# Patient Record
Sex: Female | Born: 1985 | Race: Black or African American | Hispanic: No | Marital: Single | Smoking: Never smoker
Health system: Southern US, Community
[De-identification: ages and names within clinical notes are randomized; demographics above are authoritative.]

## PROBLEM LIST (undated history)

## (undated) DIAGNOSIS — K8020 Calculus of gallbladder without cholecystitis without obstruction: Secondary | ICD-10-CM

## (undated) DIAGNOSIS — F419 Anxiety disorder, unspecified: Secondary | ICD-10-CM

---
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
## 2010-06-24 ENCOUNTER — Emergency Department: Payer: Self-pay | Admitting: Emergency Medicine

## 2010-11-20 ENCOUNTER — Emergency Department: Payer: Self-pay | Admitting: *Deleted

## 2011-07-01 ENCOUNTER — Emergency Department: Payer: Self-pay | Admitting: Emergency Medicine

## 2011-10-19 ENCOUNTER — Emergency Department: Payer: Self-pay | Admitting: Emergency Medicine

## 2011-10-21 LAB — BETA STREP CULTURE(ARMC)

## 2012-03-07 ENCOUNTER — Emergency Department: Payer: Self-pay | Admitting: Emergency Medicine

## 2012-03-08 ENCOUNTER — Emergency Department: Payer: Self-pay | Admitting: Emergency Medicine

## 2012-11-25 ENCOUNTER — Emergency Department: Payer: Self-pay | Admitting: Emergency Medicine

## 2012-11-25 LAB — CBC
HCT: 39.4 % (ref 35.0–47.0)
MCHC: 33.8 g/dL (ref 32.0–36.0)
Platelet: 375 10*3/uL (ref 150–440)
RBC: 4.6 10*6/uL (ref 3.80–5.20)

## 2012-11-25 LAB — COMPREHENSIVE METABOLIC PANEL
Alkaline Phosphatase: 54 U/L (ref 50–136)
Anion Gap: 6 — ABNORMAL LOW (ref 7–16)
BUN: 10 mg/dL (ref 7–18)
Chloride: 107 mmol/L (ref 98–107)
Creatinine: 0.78 mg/dL (ref 0.60–1.30)
EGFR (African American): >60
EGFR (Non-African Amer.): >60
Osmolality: 280 (ref 275–301)
Potassium: 3.5 mmol/L (ref 3.5–5.1)
Total Protein: 8.1 g/dL (ref 6.4–8.2)

## 2013-02-06 ENCOUNTER — Emergency Department: Payer: Self-pay | Admitting: Emergency Medicine

## 2013-02-06 LAB — URINALYSIS, COMPLETE
Bilirubin,UR: NEGATIVE
Blood: NEGATIVE
Ketone: NEGATIVE
Nitrite: NEGATIVE
Ph: 5 (ref 4.5–8.0)
RBC,UR: 6 /HPF (ref 0–5)
Specific Gravity: 1.028 (ref 1.003–1.030)
Squamous Epithelial: 22
WBC UR: 22 /HPF (ref 0–5)

## 2013-02-06 LAB — BASIC METABOLIC PANEL
Anion Gap: 3 — ABNORMAL LOW (ref 7–16)
BUN: 6 mg/dL — ABNORMAL LOW (ref 7–18)
Calcium, Total: 9.6 mg/dL (ref 8.5–10.1)
Chloride: 105 mmol/L (ref 98–107)
Co2: 29 mmol/L (ref 21–32)
Creatinine: 0.75 mg/dL (ref 0.60–1.30)
EGFR (African American): >60
Potassium: 3.6 mmol/L (ref 3.5–5.1)

## 2013-02-06 LAB — CBC
HCT: 41.2 % (ref 35.0–47.0)
RBC: 4.89 10*6/uL (ref 3.80–5.20)
RDW: 13.3 % (ref 11.5–14.5)
WBC: 6.8 10*3/uL (ref 3.6–11.0)

## 2013-02-09 ENCOUNTER — Emergency Department: Payer: Self-pay | Admitting: Emergency Medicine

## 2013-02-09 LAB — COMPREHENSIVE METABOLIC PANEL
Albumin: 4.1 g/dL (ref 3.4–5.0)
Alkaline Phosphatase: 47 U/L
BUN: 9 mg/dL (ref 7–18)
Creatinine: 0.73 mg/dL (ref 0.60–1.30)
EGFR (Non-African Amer.): >60
Potassium: 4.2 mmol/L (ref 3.5–5.1)
SGOT(AST): 44 U/L — ABNORMAL HIGH (ref 15–37)
Sodium: 139 mmol/L (ref 136–145)
Total Protein: 8.6 g/dL — ABNORMAL HIGH (ref 6.4–8.2)

## 2013-02-09 LAB — CBC
HGB: 14 g/dL (ref 12.0–16.0)
MCV: 85 fL (ref 80–100)
Platelet: 353 10*3/uL (ref 150–440)
RDW: 12.8 % (ref 11.5–14.5)

## 2013-02-09 LAB — URINALYSIS, COMPLETE
Blood: NEGATIVE
Glucose,UR: NEGATIVE mg/dL (ref 0–75)
Ketone: NEGATIVE
Protein: NEGATIVE
Specific Gravity: 1.026 (ref 1.003–1.030)
Squamous Epithelial: 11
WBC UR: 9 /HPF (ref 0–5)

## 2014-03-14 ENCOUNTER — Emergency Department: Payer: Self-pay | Admitting: Student

## 2014-04-18 ENCOUNTER — Emergency Department: Payer: Self-pay | Admitting: Emergency Medicine

## 2014-05-20 ENCOUNTER — Emergency Department: Payer: Self-pay | Admitting: Emergency Medicine

## 2014-05-27 ENCOUNTER — Emergency Department: Admit: 2014-05-27 | Disposition: A | Discharge status: Home / Self Care | Payer: Self-pay | Admitting: Student

## 2014-05-27 LAB — CBC WITH DIFFERENTIAL/PLATELET
Basophil #: 0.1 10*3/uL (ref 0.0–0.1)
Basophil %: 0.9 %
EOS ABS: 0.2 10*3/uL (ref 0.0–0.7)
Eosinophil %: 2.7 %
HCT: 46 % (ref 35.0–47.0)
HGB: 14.7 g/dL (ref 12.0–16.0)
Lymphocyte #: 1.8 10*3/uL (ref 1.0–3.6)
Lymphocyte %: 20.7 %
MCH: 27.1 pg (ref 26.0–34.0)
MCHC: 32 g/dL (ref 32.0–36.0)
MCV: 85 fL (ref 80–100)
MONO ABS: 0.4 x10 3/mm (ref 0.2–0.9)
Monocyte %: 4.1 %
NEUTROS ABS: 6.2 10*3/uL (ref 1.4–6.5)
Neutrophil %: 71.6 %
PLATELETS: 367 10*3/uL (ref 150–440)
RBC: 5.43 10*6/uL — AB (ref 3.80–5.20)
RDW: 13.6 % (ref 11.5–14.5)
WBC: 8.7 10*3/uL (ref 3.6–11.0)

## 2014-05-27 LAB — COMPREHENSIVE METABOLIC PANEL
AST: 37 U/L
Albumin: 4.7 g/dL
Alkaline Phosphatase: 53 U/L
Anion Gap: 8 (ref 7–16)
BUN: 8 mg/dL
Bilirubin,Total: 0.4 mg/dL
CREATININE: 0.73 mg/dL
Calcium, Total: 9.8 mg/dL
Chloride: 104 mmol/L
Co2: 28 mmol/L
EGFR (Non-African Amer.): >60
Glucose: 102 mg/dL — ABNORMAL HIGH
POTASSIUM: 4.3 mmol/L
SGPT (ALT): 49 U/L
Sodium: 140 mmol/L
TOTAL PROTEIN: 8.5 g/dL — AB

## 2014-05-27 LAB — URINALYSIS, COMPLETE
Bilirubin,UR: NEGATIVE
Blood: NEGATIVE
Glucose,UR: NEGATIVE mg/dL (ref 0–75)
Ketone: NEGATIVE
LEUKOCYTE ESTERASE: NEGATIVE
NITRITE: NEGATIVE
PROTEIN: NEGATIVE
Ph: 7 (ref 4.5–8.0)
RBC,UR: <1 /HPF (ref 0–5)
Specific Gravity: 1.013 (ref 1.003–1.030)
Squamous Epithelial: 3
WBC UR: 2 /HPF (ref 0–5)

## 2014-05-27 LAB — LIPASE, BLOOD: Lipase: 57 U/L — ABNORMAL HIGH

## 2014-06-17 ENCOUNTER — Emergency Department
Admit: 2014-06-17 | Disposition: A | Discharge status: Home / Self Care | Payer: Self-pay | Admitting: Emergency Medicine

## 2014-06-17 LAB — COMPREHENSIVE METABOLIC PANEL WITH GFR
Albumin: 4.5 g/dL
Alkaline Phosphatase: 48 U/L
Anion Gap: 4 — ABNORMAL LOW
BUN: 6 mg/dL
Bilirubin,Total: 0.3 mg/dL
Calcium, Total: 9.4 mg/dL
Chloride: 105 mmol/L
Co2: 28 mmol/L
Creatinine: 0.67 mg/dL
EGFR (African American): >60
EGFR (Non-African Amer.): >60
Glucose: 103 mg/dL — ABNORMAL HIGH
Potassium: 4.2 mmol/L
SGOT(AST): 27 U/L
SGPT (ALT): 22 U/L
Sodium: 137 mmol/L
Total Protein: 8.2 g/dL — ABNORMAL HIGH

## 2014-06-17 LAB — LIPASE, BLOOD: LIPASE: 30 U/L

## 2014-06-17 LAB — URINALYSIS, COMPLETE
BILIRUBIN, UR: NEGATIVE
Blood: NEGATIVE
GLUCOSE, UR: NEGATIVE mg/dL (ref 0–75)
KETONE: NEGATIVE
Leukocyte Esterase: NEGATIVE
Nitrite: NEGATIVE
Ph: 5 (ref 4.5–8.0)
Protein: NEGATIVE
SPECIFIC GRAVITY: 1.021 (ref 1.003–1.030)

## 2014-06-17 LAB — CBC
HCT: 44 % (ref 35.0–47.0)
HGB: 14.5 g/dL (ref 12.0–16.0)
MCH: 27.6 pg (ref 26.0–34.0)
MCHC: 32.9 g/dL (ref 32.0–36.0)
MCV: 84 fL (ref 80–100)
Platelet: 377 10*3/uL (ref 150–440)
RBC: 5.24 10*6/uL — ABNORMAL HIGH (ref 3.80–5.20)
RDW: 13.2 % (ref 11.5–14.5)
WBC: 7.9 10*3/uL (ref 3.6–11.0)

## 2014-09-13 ENCOUNTER — Emergency Department: Payer: Self-pay

## 2014-09-13 ENCOUNTER — Encounter: Payer: Self-pay | Admitting: *Deleted

## 2014-09-13 ENCOUNTER — Emergency Department
Admission: EM | Admit: 2014-09-13 | Discharge: 2014-09-13 | Disposition: A | Discharge status: Home / Self Care | Payer: Self-pay | Attending: Emergency Medicine | Admitting: Emergency Medicine

## 2014-09-13 DIAGNOSIS — R109 Unspecified abdominal pain: Secondary | ICD-10-CM

## 2014-09-13 DIAGNOSIS — Z791 Long term (current) use of non-steroidal anti-inflammatories (NSAID): Secondary | ICD-10-CM | POA: Insufficient documentation

## 2014-09-13 DIAGNOSIS — Z3202 Encounter for pregnancy test, result negative: Secondary | ICD-10-CM | POA: Insufficient documentation

## 2014-09-13 DIAGNOSIS — R1011 Right upper quadrant pain: Secondary | ICD-10-CM | POA: Insufficient documentation

## 2014-09-13 DIAGNOSIS — Z79899 Other long term (current) drug therapy: Secondary | ICD-10-CM | POA: Insufficient documentation

## 2014-09-13 HISTORY — DX: Anxiety disorder, unspecified: F41.9

## 2014-09-13 LAB — URINALYSIS COMPLETE WITH MICROSCOPIC (ARMC ONLY)
BILIRUBIN URINE: NEGATIVE
Glucose, UA: NEGATIVE mg/dL
Hgb urine dipstick: NEGATIVE
Ketones, ur: NEGATIVE mg/dL
NITRITE: NEGATIVE
Protein, ur: 30 mg/dL — AB
SPECIFIC GRAVITY, URINE: 1.036 — AB (ref 1.005–1.030)
pH: 5 (ref 5.0–8.0)

## 2014-09-13 LAB — CBC
HEMATOCRIT: 49 % — AB (ref 35.0–47.0)
HEMOGLOBIN: 15.6 g/dL (ref 12.0–16.0)
MCH: 26.8 pg (ref 26.0–34.0)
MCHC: 31.9 g/dL — ABNORMAL LOW (ref 32.0–36.0)
MCV: 84.2 fL (ref 80.0–100.0)
PLATELETS: 416 10*3/uL (ref 150–440)
RBC: 5.82 MIL/uL — ABNORMAL HIGH (ref 3.80–5.20)
RDW: 13.6 % (ref 11.5–14.5)
WBC: 10.4 10*3/uL (ref 3.6–11.0)

## 2014-09-13 LAB — COMPREHENSIVE METABOLIC PANEL
ALK PHOS: 46 U/L (ref 38–126)
ALT: 21 U/L (ref 14–54)
AST: 23 U/L (ref 15–41)
Albumin: 4.9 g/dL (ref 3.5–5.0)
Anion gap: 9 (ref 5–15)
BUN: 7 mg/dL (ref 6–20)
CO2: 26 mmol/L (ref 22–32)
Calcium: 10 mg/dL (ref 8.9–10.3)
Chloride: 105 mmol/L (ref 101–111)
Creatinine, Ser: 0.85 mg/dL (ref 0.44–1.00)
GLUCOSE: 100 mg/dL — AB (ref 65–99)
Potassium: 4.1 mmol/L (ref 3.5–5.1)
SODIUM: 140 mmol/L (ref 135–145)
TOTAL PROTEIN: 9.3 g/dL — AB (ref 6.5–8.1)
Total Bilirubin: 0.3 mg/dL (ref 0.3–1.2)

## 2014-09-13 LAB — POCT PREGNANCY, URINE: PREG TEST UR: NEGATIVE

## 2014-09-13 LAB — LIPASE, BLOOD: Lipase: 42 U/L (ref 22–51)

## 2014-09-13 MED ORDER — ONDANSETRON 4 MG PO TBDP
4.0000 mg | ORAL_TABLET | Freq: Once | ORAL | Status: AC
  Administered 2014-09-13: 4 mg via ORAL

## 2014-09-13 MED ORDER — DICYCLOMINE HCL 10 MG PO CAPS
20.0000 mg | ORAL_CAPSULE | Freq: Once | ORAL | Status: AC
  Administered 2014-09-13: 20 mg via ORAL
  Filled 2014-09-13: qty 2

## 2014-09-13 MED ORDER — METOCLOPRAMIDE HCL 10 MG PO TABS: 10.0000 mg | ORAL_TABLET | Freq: Four times a day (QID) | ORAL | Status: DC | PRN

## 2014-09-13 MED ORDER — HYDROCODONE-ACETAMINOPHEN 5-300 MG PO TABS: 1.0000 | ORAL_TABLET | ORAL | Status: DC | PRN

## 2014-09-13 MED ORDER — ONDANSETRON 4 MG PO TBDP
ORAL_TABLET | ORAL | Status: AC
  Administered 2014-09-13: 4 mg via ORAL
  Filled 2014-09-13: qty 1

## 2014-09-13 NOTE — ED Notes (Signed)
ruq abd pain,, left leg pain

## 2014-09-13 NOTE — Discharge Instructions (Signed)

## 2014-09-13 NOTE — ED Notes (Signed)
Pt reports RUQ abdominal pain x 2-3 days. Pt reports pain as stabbing in nature. Pt denies N/V/D

## 2014-09-13 NOTE — ED Provider Notes (Signed)
[facility] Emergency Department Provider Note  ____________________________________________  Time seen: Approximately 4 PM  I have reviewed the triage vital signs and the nursing notes.   HISTORY  Chief Complaint Abdominal Pain    HPI Alison Mathews is a 29 y.o. female with a history of sciatica who presents today with right upper quadrant pain for the past 3 days. She denies any nausea and vomiting with this. Says the pain is been constant and is worse with movement. Denies any recent heavy lifting or bending. No pain on deep inspiration or chest pain. No shortness of breath. No vaginal bleeding or discharge. No pain with urination. Says a family history of kidney stones as well as gallbladder issues.Denies being on any hormone supplements of birth control.   Past Medical History  Diagnosis Date  . Anxiety     There are no active problems to display for this patient.   History reviewed. No pertinent past surgical history.  Current Outpatient Rx  Name  Route  Sig  Dispense  Refill  . clonazePAM (KLONOPIN) 0.5 MG tablet   Oral   Take 0.5 mg by mouth 2 (two) times daily as needed for anxiety.         . cyclobenzaprine (FLEXERIL) 5 MG tablet   Oral   Take 5 mg by mouth every 8 (eight) hours as needed for muscle spasms.         Marland Kitchen FLUoxetine (PROZAC) 20 MG capsule   Oral   Take 20 mg by mouth daily.         . meloxicam (MOBIC) 15 MG tablet   Oral   Take 15 mg by mouth daily.           Allergies Motrin  No family history on file.  Social History History  Substance Use Topics  . Smoking status: Never Smoker   . Smokeless tobacco: Not on file  . Alcohol Use: No    Review of Systems Constitutional: No fever/chills Eyes: No visual changes. ENT: No sore throat. Cardiovascular: Denies chest pain. Respiratory: Denies shortness of breath. Gastrointestinal: No nausea, no vomiting.  No diarrhea.  No constipation. Genitourinary:  Negative for dysuria. Musculoskeletal: Negative for back pain. Skin: Negative for rash. Neurological: Negative for headaches, focal weakness or numbness.  10-point ROS otherwise negative.  ____________________________________________   PHYSICAL EXAM:  VITAL SIGNS: ED Triage Vitals  Enc Vitals Group     BP 09/13/14 1454 144/115 mmHg     Pulse Rate 09/13/14 1454 109     Resp 09/13/14 1454 20     Temp 09/13/14 1454 98.4 F (36.9 C)     Temp Source 09/13/14 1454 Oral     SpO2 09/13/14 1454 100 %     Weight 09/13/14 1454 185 lb (83.915 kg)     Height 09/13/14 1454  (1.6 m)     Head Cir --      Peak Flow --      Pain Score 09/13/14 1455 7     Pain Loc --      Pain Edu? --      Excl. in GC? --     Constitutional: Alert and oriented. Well appearing and in no acute distress. Eyes: Conjunctivae are normal. PERRL. EOMI. Head: Atraumatic. Nose: No congestion/rhinnorhea. Mouth/Throat: Mucous membranes are moist.  Oropharynx non-erythematous. Neck: No stridor.   Cardiovascular: Normal rate, regular rhythm. Grossly normal heart sounds.  Good peripheral circulation. Respiratory: Normal respiratory effort.  No retractions. Lungs  CTAB. Gastrointestinal: Soft with right upper quadrant tenderness with a negative Murphy sign. No right lower quadrant tenderness. Negative McBurney's point tenderness.. No distention. No abdominal bruits. No CVA tenderness. Musculoskeletal: No lower extremity tenderness nor edema.  No joint effusions. Neurologic:  Normal speech and language. No gross focal neurologic deficits are appreciated. No gait instability. Skin:  Skin is warm, dry and intact. No rash noted. Psychiatric: Mood and affect are normal. Speech and behavior are normal.  ____________________________________________   LABS (all labs ordered are listed, but only abnormal results are displayed)  Labs Reviewed  COMPREHENSIVE METABOLIC PANEL - Abnormal; Notable for the following:     Glucose, Bld 100 (*)    Total Protein 9.3 (*)    All other components within normal limits  CBC - Abnormal; Notable for the following:    RBC 5.82 (*)    HCT 49.0 (*)    MCHC 31.9 (*)    All other components within normal limits  URINALYSIS COMPLETEWITH MICROSCOPIC ([facility] ONLY) - Abnormal; Notable for the following:    Color, Urine YELLOW (*)    APPearance HAZY (*)    Specific Gravity, Urine 1.036 (*)    Protein, ur 30 (*)    Leukocytes, UA 1+ (*)    Bacteria, UA RARE (*)    Squamous Epithelial / LPF 6-30 (*)    All other components within normal limits  LIPASE, BLOOD  POC URINE PREG, ED  POCT PREGNANCY, URINE   ____________________________________________  EKG   ____________________________________________  RADIOLOGY  No cholelithiasis or sonographic evidence of acute cholecystitis. Mild increased hepatic parenchymal echogenicity as can be seen with hepatic steatosis. ____________________________________________   PROCEDURES    ____________________________________________   INITIAL IMPRESSION / ASSESSMENT AND PLAN / ED COURSE  Pertinent labs & imaging results that were available during my care of the patient were reviewed by me and considered in my medical decision making (see chart for details).  ----------------------------------------- 7:18 PM on 09/13/2014 -----------------------------------------  Patient resting comfortably at this time. Had vomited once in the emergency department and given Zofran and now is able to tolerate by mouth fluids. Asked again and still without any pain with deep breaths. Denies any chest pain at this time. Heart rate now 85 without any fluid intervention. Says will be able to follow up with primary care doctor at [facility]. We'll discharge home with nausea medicine as well as pain medications. Discussed with patient that she may need a HIDA scan to further evaluate her gallbladder. The patient is also saying now that the pain is been  ongoing for 1 month. Less likely to be one month of pulmonary embolus. Feel more likely to be gallbladder or abdominal etiology. ____________________________________________   FINAL CLINICAL IMPRESSION(S) / ED DIAGNOSES  Final diagnoses:  Abdominal pain      Myrna Blazer, MD 09/13/14 1919

## 2014-09-15 LAB — URINE CULTURE

## 2014-11-10 ENCOUNTER — Encounter: Payer: Self-pay | Admitting: Emergency Medicine

## 2014-11-10 ENCOUNTER — Emergency Department
Admission: EM | Admit: 2014-11-10 | Discharge: 2014-11-10 | Disposition: A | Discharge status: Home / Self Care | Payer: Self-pay | Attending: Emergency Medicine | Admitting: Emergency Medicine

## 2014-11-10 DIAGNOSIS — R1011 Right upper quadrant pain: Secondary | ICD-10-CM | POA: Insufficient documentation

## 2014-11-10 DIAGNOSIS — R1010 Upper abdominal pain, unspecified: Secondary | ICD-10-CM

## 2014-11-10 DIAGNOSIS — R1012 Left upper quadrant pain: Secondary | ICD-10-CM | POA: Insufficient documentation

## 2014-11-10 DIAGNOSIS — Z79899 Other long term (current) drug therapy: Secondary | ICD-10-CM | POA: Insufficient documentation

## 2014-11-10 DIAGNOSIS — R1110 Vomiting, unspecified: Secondary | ICD-10-CM | POA: Insufficient documentation

## 2014-11-10 DIAGNOSIS — R197 Diarrhea, unspecified: Secondary | ICD-10-CM | POA: Insufficient documentation

## 2014-11-10 DIAGNOSIS — Z791 Long term (current) use of non-steroidal anti-inflammatories (NSAID): Secondary | ICD-10-CM | POA: Insufficient documentation

## 2014-11-10 HISTORY — DX: Calculus of gallbladder without cholecystitis without obstruction: K80.20

## 2014-11-10 LAB — CBC WITH DIFFERENTIAL/PLATELET
BASOS PCT: 1 %
Basophils Absolute: 0.1 10*3/uL (ref 0–0.1)
Eosinophils Absolute: 0.3 10*3/uL (ref 0–0.7)
Eosinophils Relative: 2 %
HEMATOCRIT: 40.3 % (ref 35.0–47.0)
Hemoglobin: 13.5 g/dL (ref 12.0–16.0)
Lymphocytes Relative: 34 %
Lymphs Abs: 3.8 10*3/uL — ABNORMAL HIGH (ref 1.0–3.6)
MCH: 28.6 pg (ref 26.0–34.0)
MCHC: 33.5 g/dL (ref 32.0–36.0)
MCV: 85.2 fL (ref 80.0–100.0)
MONO ABS: 0.7 10*3/uL (ref 0.2–0.9)
Monocytes Relative: 6 %
NEUTROS ABS: 6.4 10*3/uL (ref 1.4–6.5)
NEUTROS PCT: 57 %
Platelets: 348 10*3/uL (ref 150–440)
RBC: 4.73 MIL/uL (ref 3.80–5.20)
RDW: 13.6 % (ref 11.5–14.5)
WBC: 11.3 10*3/uL — ABNORMAL HIGH (ref 3.6–11.0)

## 2014-11-10 LAB — COMPREHENSIVE METABOLIC PANEL
ALT: 32 U/L (ref 14–54)
AST: 34 U/L (ref 15–41)
Albumin: 4.3 g/dL (ref 3.5–5.0)
Alkaline Phosphatase: 47 U/L (ref 38–126)
Anion gap: 4 — ABNORMAL LOW (ref 5–15)
BUN: 8 mg/dL (ref 6–20)
CO2: 29 mmol/L (ref 22–32)
Calcium: 9.4 mg/dL (ref 8.9–10.3)
Chloride: 107 mmol/L (ref 101–111)
Creatinine, Ser: 0.8 mg/dL (ref 0.44–1.00)
GFR calc Af Amer: >60 mL/min (ref >60)
GFR calc non Af Amer: >60 mL/min (ref >60)
Glucose, Bld: 100 mg/dL — ABNORMAL HIGH (ref 65–99)
Potassium: 3.8 mmol/L (ref 3.5–5.1)
Sodium: 140 mmol/L (ref 135–145)
Total Bilirubin: 0.5 mg/dL (ref 0.3–1.2)
Total Protein: 7.9 g/dL (ref 6.5–8.1)

## 2014-11-10 LAB — ETHANOL: Alcohol, Ethyl (B): <5 mg/dL (ref <5)

## 2014-11-10 LAB — LIPASE, BLOOD: Lipase: 30 U/L (ref 22–51)

## 2014-11-10 MED ORDER — RANITIDINE HCL 150 MG PO CAPS: 150.0000 mg | ORAL_CAPSULE | Freq: Two times a day (BID) | ORAL | Status: DC

## 2014-11-10 MED ORDER — FAMOTIDINE 20 MG PO TABS
ORAL_TABLET | ORAL | Status: AC
  Filled 2014-11-10: qty 1

## 2014-11-10 MED ORDER — KETOROLAC TROMETHAMINE 30 MG/ML IJ SOLN
INTRAMUSCULAR | Status: AC
  Filled 2014-11-10: qty 1

## 2014-11-10 MED ORDER — ONDANSETRON 8 MG PO TBDP: 8.0000 mg | ORAL_TABLET | Freq: Three times a day (TID) | ORAL | Status: DC | PRN

## 2014-11-10 MED ORDER — ONDANSETRON HCL 4 MG/2ML IJ SOLN
4.0000 mg | Freq: Once | INTRAMUSCULAR | Status: AC
  Administered 2014-11-10: 4 mg via INTRAVENOUS
  Filled 2014-11-10: qty 2

## 2014-11-10 MED ORDER — GI COCKTAIL ~~LOC~~
30.0000 mL | ORAL | Status: AC
  Administered 2014-11-10: 30 mL via ORAL
  Filled 2014-11-10: qty 30

## 2014-11-10 MED ORDER — SUCRALFATE 1 G PO TABS: 1.0000 g | ORAL_TABLET | Freq: Four times a day (QID) | ORAL | Status: DC

## 2014-11-10 MED ORDER — FAMOTIDINE 20 MG PO TABS
40.0000 mg | ORAL_TABLET | Freq: Once | ORAL | Status: AC
  Administered 2014-11-10: 40 mg via ORAL
  Filled 2014-11-10: qty 2

## 2014-11-10 MED ORDER — KETOROLAC TROMETHAMINE 30 MG/ML IJ SOLN
30.0000 mg | Freq: Once | INTRAMUSCULAR | Status: AC
  Administered 2014-11-10: 30 mg via INTRAVENOUS

## 2014-11-10 NOTE — ED Notes (Signed)
Pt to rm 9 via EMS.  EMS report pt hx gall bladder problems.  Pt had RUQ pain starting yesterday afternoon.  Pt reports vomit x2 and diarrhea x 2.  Pt reports continued nausea.  Pt NAD at this time.

## 2014-11-10 NOTE — Discharge Instructions (Signed)

## 2014-11-10 NOTE — ED Provider Notes (Signed)
[facility] Emergency Department Provider Note  ____________________________________________  Time seen: 4:05 AM on arrival by EMS  I have reviewed the triage vital signs and the nursing notes.   HISTORY  Chief Complaint Abdominal Pain    HPI Alison Mathews is a 29 y.o. female who reports right upper quadrant pain that started about 3 PM yesterday. She reports a history of gallbladder problems but has not followed up or had her gallbladder removed yet. She reports vomiting and having 2 episodes of diarrhea twice in the meantime. She last ate at 3 AM this morning one hour ago. No fevers or chills. No trauma no chest pain or shortness of breath. No dysuria frequency urgency.     Past Medical History  Diagnosis Date  . Anxiety   . Gall stones      There are no active problems to display for this patient.    History reviewed. No pertinent past surgical history.   Current Outpatient Rx  Name  Route  Sig  Dispense  Refill  . clonazePAM (KLONOPIN) 0.5 MG tablet   Oral   Take 0.5 mg by mouth 2 (two) times daily as needed for anxiety.         Marland Kitchen FLUoxetine (PROZAC) 20 MG capsule   Oral   Take 20 mg by mouth daily.         . cyclobenzaprine (FLEXERIL) 5 MG tablet   Oral   Take 5 mg by mouth every 8 (eight) hours as needed for muscle spasms.         . Hydrocodone-Acetaminophen (VICODIN) 5-300 MG TABS   Oral   Take 1 tablet by mouth every 4 (four) hours as needed (pain).   12 each   0   . meloxicam (MOBIC) 15 MG tablet   Oral   Take 15 mg by mouth daily.         . metoCLOPramide (REGLAN) 10 MG tablet   Oral   Take 1 tablet (10 mg total) by mouth every 6 (six) hours as needed for nausea or vomiting.   12 tablet   1   . ondansetron (ZOFRAN ODT) 8 MG disintegrating tablet   Oral   Take 1 tablet (8 mg total) by mouth every 8 (eight) hours as needed for nausea or vomiting.   20 tablet   0   . PARoxetine (PAXIL) 20 MG tablet   Oral    Take 20 mg by mouth daily.         . ranitidine (ZANTAC) 150 MG capsule   Oral   Take 1 capsule (150 mg total) by mouth 2 (two) times daily.   28 capsule   0   . sucralfate (CARAFATE) 1 G tablet   Oral   Take 1 tablet (1 g total) by mouth 4 (four) times daily.   120 tablet   1      Allergies Motrin   History reviewed. No pertinent family history.  Social History Social History  Substance Use Topics  . Smoking status: Never Smoker   . Smokeless tobacco: None  . Alcohol Use: No    Review of Systems  Constitutional:   No fever or chills. No weight changes Eyes:   No blurry vision or double vision.  ENT:   No sore throat. Cardiovascular:   No chest pain. Respiratory:   No dyspnea or cough. Gastrointestinal:   Abdominal pain with vomiting and diarrhea.  No BRBPR or melena. Genitourinary:   Negative for  dysuria, urinary retention, bloody urine, or difficulty urinating. Musculoskeletal:   Negative for back pain. No joint swelling or pain. Skin:   Negative for rash. Neurological:   Negative for headaches, focal weakness or numbness. Psychiatric:  No anxiety or depression.   Endocrine:  No hot/cold intolerance, changes in energy, or sleep difficulty.  10-point ROS otherwise negative.  ____________________________________________   PHYSICAL EXAM:  VITAL SIGNS: ED Triage Vitals  Enc Vitals Group     BP 11/10/14 0407 138/89 mmHg     Pulse Rate 11/10/14 0407 113     Resp 11/10/14 0407 18     Temp 11/10/14 0407 98.5 F (36.9 C)     Temp Source 11/10/14 0407 Oral     SpO2 11/10/14 0407 99 %     Weight 11/10/14 0407 180 lb (81.647 kg)     Height 11/10/14 0407  (1.6 m)     Head Cir --      Peak Flow --      Pain Score 11/10/14 0408 8     Pain Loc --      Pain Edu? --      Excl. in GC? --      Constitutional:   Alert and oriented. Well appearing and in no distress. Eyes:   No scleral icterus. No conjunctival pallor. PERRL. EOMI ENT   Head:    Normocephalic and atraumatic.   Nose:   No congestion/rhinnorhea. No septal hematoma   Mouth/Throat:   MMM, no pharyngeal erythema. No peritonsillar mass. No uvula shift.   Neck:   No stridor. No SubQ emphysema. No meningismus. Hematological/Lymphatic/Immunilogical:   No cervical lymphadenopathy. Cardiovascular:   RRR. Normal and symmetric distal pulses are present in all extremities. No murmurs, rubs, or gallops. Respiratory:   Normal respiratory effort without tachypnea nor retractions. Breath sounds are clear and equal bilaterally. No wheezes/rales/rhonchi. Gastrointestinal:   Soft with left upper quadrant and epigastric tenderness as well as mild right flank tenderness. No significant right upper quadrant tenderness no McBurney's point tenderness. No distention. There is no CVA tenderness.  No rebound, rigidity, or guarding. Genitourinary:   deferred Musculoskeletal:   Nontender with normal range of motion in all extremities. No joint effusions.  No lower extremity tenderness.  No edema. Neurologic:   Normal speech and language.  CN 2-10 normal. Motor grossly intact. No pronator drift.  Normal gait. No gross focal neurologic deficits are appreciated.  Skin:    Skin is warm, dry and intact. No rash noted.  No petechiae, purpura, or bullae. Psychiatric:   Mood and affect are normal. Speech and behavior are normal. Patient exhibits appropriate insight and judgment.  ____________________________________________    LABS (pertinent positives/negatives) (all labs ordered are listed, but only abnormal results are displayed) Labs Reviewed  COMPREHENSIVE METABOLIC PANEL - Abnormal; Notable for the following:    Glucose, Bld 100 (*)    Anion gap 4 (*)    All other components within normal limits  CBC WITH DIFFERENTIAL/PLATELET - Abnormal; Notable for the following:    WBC 11.3 (*)    Lymphs Abs 3.8 (*)    All other components within normal limits  LIPASE, BLOOD  ETHANOL    ____________________________________________   EKG  Interpreted by me Sinus tachycardia rate 110, normal axis intervals QRS and ST segments and T waves. There is an isolated T-wave inversion in lead 3 which is nonspecific.  ____________________________________________    RADIOLOGY  Recent ultrasound right upper quadrant in July of this year  unremarkable. No gallstones at that time.  ____________________________________________   PROCEDURES   ____________________________________________   INITIAL IMPRESSION / ASSESSMENT AND PLAN / ED COURSE  Pertinent labs & imaging results that were available during my care of the patient were reviewed by me and considered in my medical decision making (see chart for details).  Patient presents with upper abdominal pain after recent the eating and also the context of being asleep at night and lying flat, most consistent with gastritis or GERD. With the recent negative ultrasound I have a very low suspicion that this could be biliary in nature especially as the patient is afebrile. We'll check labs and if there are any significant abnormalities with an reassess and consider abdominal imaging. Zofran and Toradol for pain as well as GI cocktail and Pepcid.  ----------------------------------------- 5:41 AM on 11/10/2014 -----------------------------------------  Labs completely unremarkable. Patient is calm and comfortable although she is requesting opioids for pain and stating that she's had inadequate relief of pain with Toradol. Objectively this does not appear to be the case. Abdomen remains nontender in the right upper quadrant. Vital signs are completely normal after the initial arrival vital signs, with heart rate in the 80s. Low suspicion for pancreatitis AAA TAD PID and ectopic or pregnancy appendicitis perforation or obstruction. Low suspicion for any infectious origin. We'll give her Carafate and Zantac and have her follow up with  primary care.     ____________________________________________   FINAL CLINICAL IMPRESSION(S) / ED DIAGNOSES  Final diagnoses:  Upper abdominal pain      Sharman Cheek, MD 11/10/14 [id number]

## 2014-11-12 ENCOUNTER — Emergency Department: Payer: Self-pay

## 2014-11-12 ENCOUNTER — Emergency Department
Admission: EM | Admit: 2014-11-12 | Discharge: 2014-11-12 | Disposition: A | Discharge status: Home / Self Care | Payer: Self-pay | Attending: Student | Admitting: Student

## 2014-11-12 ENCOUNTER — Encounter: Payer: Self-pay | Admitting: Emergency Medicine

## 2014-11-12 DIAGNOSIS — R197 Diarrhea, unspecified: Secondary | ICD-10-CM | POA: Insufficient documentation

## 2014-11-12 DIAGNOSIS — Z79899 Other long term (current) drug therapy: Secondary | ICD-10-CM | POA: Insufficient documentation

## 2014-11-12 DIAGNOSIS — R1013 Epigastric pain: Secondary | ICD-10-CM | POA: Insufficient documentation

## 2014-11-12 DIAGNOSIS — R52 Pain, unspecified: Secondary | ICD-10-CM

## 2014-11-12 DIAGNOSIS — Z3202 Encounter for pregnancy test, result negative: Secondary | ICD-10-CM | POA: Insufficient documentation

## 2014-11-12 DIAGNOSIS — R110 Nausea: Secondary | ICD-10-CM | POA: Insufficient documentation

## 2014-11-12 DIAGNOSIS — R1011 Right upper quadrant pain: Secondary | ICD-10-CM | POA: Insufficient documentation

## 2014-11-12 LAB — URINALYSIS COMPLETE WITH MICROSCOPIC (ARMC ONLY)
Bilirubin Urine: NEGATIVE
GLUCOSE, UA: NEGATIVE mg/dL
KETONES UR: NEGATIVE mg/dL
Leukocytes, UA: NEGATIVE
Nitrite: NEGATIVE
PROTEIN: NEGATIVE mg/dL
RBC / HPF: NONE SEEN RBC/hpf (ref 0–5)
Specific Gravity, Urine: 1.012 (ref 1.005–1.030)
pH: 7 (ref 5.0–8.0)

## 2014-11-12 LAB — CBC
HCT: 42.6 % (ref 35.0–47.0)
Hemoglobin: 14.1 g/dL (ref 12.0–16.0)
MCH: 27.7 pg (ref 26.0–34.0)
MCHC: 33 g/dL (ref 32.0–36.0)
MCV: 83.9 fL (ref 80.0–100.0)
PLATELETS: 349 10*3/uL (ref 150–440)
RBC: 5.08 MIL/uL (ref 3.80–5.20)
RDW: 13.5 % (ref 11.5–14.5)
WBC: 6.9 10*3/uL (ref 3.6–11.0)

## 2014-11-12 LAB — COMPREHENSIVE METABOLIC PANEL
ALK PHOS: 48 U/L (ref 38–126)
ALT: 30 U/L (ref 14–54)
AST: 28 U/L (ref 15–41)
Albumin: 4.5 g/dL (ref 3.5–5.0)
Anion gap: 7 (ref 5–15)
BUN: 10 mg/dL (ref 6–20)
CALCIUM: 9.6 mg/dL (ref 8.9–10.3)
CHLORIDE: 105 mmol/L (ref 101–111)
CO2: 27 mmol/L (ref 22–32)
CREATININE: 0.87 mg/dL (ref 0.44–1.00)
GFR calc Af Amer: >60 mL/min (ref >60)
GFR calc non Af Amer: >60 mL/min (ref >60)
GLUCOSE: 95 mg/dL (ref 65–99)
Potassium: 4 mmol/L (ref 3.5–5.1)
SODIUM: 139 mmol/L (ref 135–145)
Total Bilirubin: 0.4 mg/dL (ref 0.3–1.2)
Total Protein: 8.2 g/dL — ABNORMAL HIGH (ref 6.5–8.1)

## 2014-11-12 LAB — LIPASE, BLOOD: LIPASE: 28 U/L (ref 22–51)

## 2014-11-12 MED ORDER — ONDANSETRON 4 MG PO TBDP
4.0000 mg | ORAL_TABLET | Freq: Once | ORAL | Status: AC
  Administered 2014-11-12: 4 mg via ORAL
  Filled 2014-11-12: qty 1

## 2014-11-12 MED ORDER — KETOROLAC TROMETHAMINE 60 MG/2ML IM SOLN
60.0000 mg | Freq: Once | INTRAMUSCULAR | Status: AC
  Administered 2014-11-12: 60 mg via INTRAMUSCULAR
  Filled 2014-11-12: qty 2

## 2014-11-12 MED ORDER — GI COCKTAIL ~~LOC~~
30.0000 mL | Freq: Once | ORAL | Status: AC
  Administered 2014-11-12: 30 mL via ORAL
  Filled 2014-11-12: qty 30

## 2014-11-12 NOTE — ED Provider Notes (Signed)
[facility] Emergency Department Provider Note  ____________________________________________  Time seen: Approximately 2:13 PM  I have reviewed the triage vital signs and the nursing notes.   HISTORY  Chief Complaint Abdominal Pain    HPI Alison Mathews is a 29 y.o. female history of anxiety, depression, gallstones presents for evaluation of several months of right upper quadrant pain and epigastric pain, recurrent. the patient reports her pain was due to "gallbladder issues" in the past. She has had pain on and off for the past 2-3 months. She reports that she began having a flare of her pain 2 days ago. She has had burning aching right upper quadrant and epigastric pain which has been constant since onset. No modifying factors. She has had nausea but no vomiting. She had some nonbloody diarrhea yesterday but none today. No chest pain or difficulty breathing. Currently her symptoms are mild to moderate.   Past Medical History  Diagnosis Date  . Anxiety   . Gall stones     There are no active problems to display for this patient.   History reviewed. No pertinent past surgical history.  Current Outpatient Rx  Name  Route  Sig  Dispense  Refill  . clonazePAM (KLONOPIN) 0.5 MG tablet   Oral   Take 0.5 mg by mouth 2 (two) times daily as needed for anxiety.         Marland Kitchen FLUoxetine (PROZAC) 20 MG capsule   Oral   Take 20 mg by mouth daily.         . meloxicam (MOBIC) 15 MG tablet   Oral   Take 15 mg by mouth at bedtime.          . ondansetron (ZOFRAN ODT) 8 MG disintegrating tablet   Oral   Take 1 tablet (8 mg total) by mouth every 8 (eight) hours as needed for nausea or vomiting.   20 tablet   0   . ranitidine (ZANTAC) 150 MG capsule   Oral   Take 1 capsule (150 mg total) by mouth 2 (two) times daily.   28 capsule   0   . sucralfate (CARAFATE) 1 G tablet   Oral   Take 1 tablet (1 g total) by mouth 4 (four) times daily.   120 tablet    1   . Hydrocodone-Acetaminophen (VICODIN) 5-300 MG TABS   Oral   Take 1 tablet by mouth every 4 (four) hours as needed (pain). Patient not taking: Reported on 11/12/2014   12 each   0   . metoCLOPramide (REGLAN) 10 MG tablet   Oral   Take 1 tablet (10 mg total) by mouth every 6 (six) hours as needed for nausea or vomiting. Patient not taking: Reported on 11/12/2014   12 tablet   1     Allergies Motrin  No family history on file.  Social History Social History  Substance Use Topics  . Smoking status: Never Smoker   . Smokeless tobacco: None  . Alcohol Use: No    Review of Systems Constitutional: No fever/chills Eyes: No visual changes. ENT: No sore throat. Cardiovascular: Denies chest pain. Respiratory: Denies shortness of breath. Gastrointestinal: + abdominal pain.  + nausea, no vomiting.  + diarrhea.  No constipation. Genitourinary: Negative for dysuria. Musculoskeletal: Negative for back pain. Skin: Negative for rash. Neurological: Negative for headaches, focal weakness or numbness.  10-point ROS otherwise negative.  ____________________________________________   PHYSICAL EXAM:  VITAL SIGNS: ED Triage Vitals  Enc Vitals Group  BP 11/12/14 1239 131/86 mmHg     Pulse Rate 11/12/14 1239 105     Resp 11/12/14 1239 20     Temp 11/12/14 1239 98.3 F (36.8 C)     Temp Source 11/12/14 1239 Oral     SpO2 11/12/14 1239 100 %     Weight 11/12/14 1239 180 lb (81.647 kg)     Height 11/12/14 1239  (1.6 m)     Head Cir --      Peak Flow --      Pain Score 11/12/14 1250 8     Pain Loc --      Pain Edu? --      Excl. in GC? --     Constitutional: Alert and oriented. Well appearing and in no acute distress. Eyes: Conjunctivae are normal. PERRL. EOMI. Head: Atraumatic. Nose: No congestion/rhinnorhea. Mouth/Throat: Mucous membranes are moist.  Oropharynx non-erythematous. Neck: No stridor.   Cardiovascular: Normal rate, regular rhythm. Grossly normal  heart sounds.  Good peripheral circulation. Respiratory: Normal respiratory effort.  No retractions. Lungs CTAB. Gastrointestinal: Soft with mild epigastric and right upper quadrant tenderness to palpation, no rebound or guarding.No CVA tenderness. Genitourinary: deferred Musculoskeletal: No lower extremity tenderness nor edema.  No joint effusions. Neurologic:  Normal speech and language. No gross focal neurologic deficits are appreciated. No gait instability. Skin:  Skin is warm, dry and intact. No rash noted. Psychiatric: Mood and affect are normal. Speech and behavior are normal.  ____________________________________________   LABS (all labs ordered are listed, but only abnormal results are displayed)  Labs Reviewed  COMPREHENSIVE METABOLIC PANEL - Abnormal; Notable for the following:    Total Protein 8.2 (*)    All other components within normal limits  URINALYSIS COMPLETEWITH MICROSCOPIC ([facility] ONLY) - Abnormal; Notable for the following:    Color, Urine YELLOW (*)    APPearance CLEAR (*)    Hgb urine dipstick 1+ (*)    Bacteria, UA RARE (*)    Squamous Epithelial / LPF 0-5 (*)    All other components within normal limits  LIPASE, BLOOD  CBC  POC URINE PREG, ED   ____________________________________________  EKG  ED ECG REPORT I, Gayla Doss, the attending physician, personally viewed and interpreted this ECG.   Date: 11/12/2014  EKG Time: 12:44  Rate: 101  Rhythm: sinus tachycardia  Axis: Normal axis  Intervals:none  ST&T Change: no acute ST elevation  ____________________________________________  RADIOLOGY  RUQ ultrasound FINDINGS: Gallbladder:  No gallstones or wall thickening visualized. No sonographic Murphy sign noted.  Common bile duct:  Diameter: Normal caliber, 3 mm.  Liver:  Increased echotexture throughout the liver suggesting fatty infiltration. No focal abnormality.  IMPRESSION: Fatty liver. Otherwise unremarkable right upper  quadrant ultrasound.   ____________________________________________   PROCEDURES  Procedure(s) performed: None  Critical Care performed: No  ____________________________________________   INITIAL IMPRESSION / ASSESSMENT AND PLAN / ED COURSE  Pertinent labs & imaging results that were available during my care of the patient were reviewed by me and considered in my medical decision making (see chart for details).  Alison Mathews is a 29 y.o. female history of anxiety, depression, gallstones presents for evaluation of several months of right upper quadrant pain and epigastric pain. On exam, she is very well-appearing and in no acute distress. She was initially mild tachycardic on arrival however this has resolved at the time of my examination. She has mild epigastric and right upper quadrant tenderness to palpation.CBC, CMP and lipase  are unremarkable. Urinalysis not consistent with infection and she denies any urinary complaints. RUQ Ultrasound shows fatty liver but no acute bladder pathology. Suspect her symptoms may be secondary to GERD or gastritis. We discussed that she needs to be compliant with her antacid medications and follow-up with her primary care doctor as soon as possible. I discussed with her that she may need a GI referral in the future if her symptoms are ongoing. EKG is reassuring, not consistent with atypical ACS. We discussed return precautions, need for PCP follow-up and she is comfortable with the discharge plan. ____________________________________________   FINAL CLINICAL IMPRESSION(S) / ED DIAGNOSES  Final diagnoses:  RUQ abdominal pain      Gayla Doss, MD 11/12/14 1540

## 2014-11-12 NOTE — ED Notes (Signed)
Pt reports RUQ pain, hx of gallbladder issues. Pt reports nausea, denies diarrhea.

## 2014-11-12 NOTE — ED Notes (Signed)
POCT preg neg.  

## 2014-11-12 NOTE — ED Notes (Signed)
Patient transported to Ultrasound 

## 2014-11-12 NOTE — ED Notes (Addendum)
Pt reports right upper quadrant pain for two days. Pt reports diarrhea yesterday but none today. Pt reports feeling nauseous but denies vomiting. Pt reports having no appetite and denies eating or drinking since yesterday.

## 2015-03-30 ENCOUNTER — Emergency Department
Admission: EM | Admit: 2015-03-30 | Discharge: 2015-03-30 | Disposition: A | Discharge status: Home / Self Care | Payer: Self-pay | Attending: Emergency Medicine | Admitting: Emergency Medicine

## 2015-03-30 ENCOUNTER — Emergency Department: Payer: Self-pay

## 2015-03-30 ENCOUNTER — Encounter: Payer: Self-pay | Admitting: Emergency Medicine

## 2015-03-30 DIAGNOSIS — Z79899 Other long term (current) drug therapy: Secondary | ICD-10-CM | POA: Insufficient documentation

## 2015-03-30 DIAGNOSIS — Z791 Long term (current) use of non-steroidal anti-inflammatories (NSAID): Secondary | ICD-10-CM | POA: Insufficient documentation

## 2015-03-30 DIAGNOSIS — R1011 Right upper quadrant pain: Secondary | ICD-10-CM

## 2015-03-30 DIAGNOSIS — F419 Anxiety disorder, unspecified: Secondary | ICD-10-CM | POA: Insufficient documentation

## 2015-03-30 DIAGNOSIS — N390 Urinary tract infection, site not specified: Secondary | ICD-10-CM | POA: Insufficient documentation

## 2015-03-30 DIAGNOSIS — Z3202 Encounter for pregnancy test, result negative: Secondary | ICD-10-CM | POA: Insufficient documentation

## 2015-03-30 LAB — COMPREHENSIVE METABOLIC PANEL
ALBUMIN: 4.6 g/dL (ref 3.5–5.0)
ALK PHOS: 45 U/L (ref 38–126)
ALT: 21 U/L (ref 14–54)
ANION GAP: 6 (ref 5–15)
AST: 23 U/L (ref 15–41)
BUN: 10 mg/dL (ref 6–20)
CALCIUM: 9.9 mg/dL (ref 8.9–10.3)
CO2: 29 mmol/L (ref 22–32)
CREATININE: 0.82 mg/dL (ref 0.44–1.00)
Chloride: 107 mmol/L (ref 101–111)
GFR calc Af Amer: >60 mL/min (ref >60)
GFR calc non Af Amer: >60 mL/min (ref >60)
GLUCOSE: 95 mg/dL (ref 65–99)
Potassium: 4 mmol/L (ref 3.5–5.1)
Sodium: 142 mmol/L (ref 135–145)
Total Bilirubin: 0.3 mg/dL (ref 0.3–1.2)
Total Protein: 8.7 g/dL — ABNORMAL HIGH (ref 6.5–8.1)

## 2015-03-30 LAB — URINALYSIS COMPLETE WITH MICROSCOPIC (ARMC ONLY)
BILIRUBIN URINE: NEGATIVE
GLUCOSE, UA: NEGATIVE mg/dL
KETONES UR: NEGATIVE mg/dL
NITRITE: NEGATIVE
PROTEIN: NEGATIVE mg/dL
Specific Gravity, Urine: 1.028 (ref 1.005–1.030)
pH: 6 (ref 5.0–8.0)

## 2015-03-30 LAB — CBC
HCT: 46.4 % (ref 35.0–47.0)
Hemoglobin: 14.9 g/dL (ref 12.0–16.0)
MCH: 27.4 pg (ref 26.0–34.0)
MCHC: 32 g/dL (ref 32.0–36.0)
MCV: 85.5 fL (ref 80.0–100.0)
PLATELETS: 420 10*3/uL (ref 150–440)
RBC: 5.43 MIL/uL — ABNORMAL HIGH (ref 3.80–5.20)
RDW: 13.7 % (ref 11.5–14.5)
WBC: 10.8 10*3/uL (ref 3.6–11.0)

## 2015-03-30 LAB — POCT PREGNANCY, URINE: Preg Test, Ur: NEGATIVE

## 2015-03-30 LAB — LIPASE, BLOOD: Lipase: 39 U/L (ref 11–51)

## 2015-03-30 MED ORDER — MORPHINE SULFATE (PF) 4 MG/ML IV SOLN
INTRAVENOUS | Status: AC
  Administered 2015-03-30: 4 mg via INTRAVENOUS
  Filled 2015-03-30: qty 1

## 2015-03-30 MED ORDER — MORPHINE SULFATE (PF) 4 MG/ML IV SOLN
4.0000 mg | Freq: Once | INTRAVENOUS | Status: AC
  Administered 2015-03-30: 4 mg via INTRAVENOUS

## 2015-03-30 MED ORDER — SODIUM CHLORIDE 0.9 % IV BOLUS (SEPSIS)
1000.0000 mL | Freq: Once | INTRAVENOUS | Status: AC
  Administered 2015-03-30: 1000 mL via INTRAVENOUS

## 2015-03-30 MED ORDER — ONDANSETRON HCL 4 MG/2ML IJ SOLN
INTRAMUSCULAR | Status: AC
  Administered 2015-03-30: 4 mg via INTRAVENOUS
  Filled 2015-03-30: qty 2

## 2015-03-30 MED ORDER — CEPHALEXIN 500 MG PO CAPS: 500.0000 mg | ORAL_CAPSULE | Freq: Four times a day (QID) | ORAL | Status: AC

## 2015-03-30 MED ORDER — TRAMADOL HCL 50 MG PO TABS: 50.0000 mg | ORAL_TABLET | Freq: Four times a day (QID) | ORAL | Status: DC | PRN

## 2015-03-30 MED ORDER — ONDANSETRON HCL 4 MG/2ML IJ SOLN
4.0000 mg | Freq: Once | INTRAMUSCULAR | Status: AC
  Administered 2015-03-30: 4 mg via INTRAVENOUS

## 2015-03-30 NOTE — ED Provider Notes (Addendum)
[facility] Emergency Department Provider Note  ____________________________________________   I have reviewed the triage vital signs and the nursing notes.   HISTORY  Chief Complaint Flank Pain    HPI Alison Mathews is a 30 y.o. female Presents today complaining of right upper quadrant abdominal pain. It goes around towards the right side. She states she does have a history of gallstones in her family.She has had no vomiting. She thinks it may be related to food but she is not sure. Pain is getting gradually worse over the last 3-4 days. She has had multiple different times the last several months. Patient does have a history of irregular menstrual periods. She has had multiple negative urine tests at home however. Her last period which she states was a few months ago which is not atypical for her. She denies any vaginal bleeding. The patient states that this pain makes her quite anxious. She denies any dysuria or urinary frequency. She denies any vaginal discharge. She denies any diarrhea or vomiting but she has had some mild nausea.  Past Medical History  Diagnosis Date  . Anxiety   . Gall stones     There are no active problems to display for this patient.   History reviewed. No pertinent past surgical history.  Current Outpatient Rx  Name  Route  Sig  Dispense  Refill  . clonazePAM (KLONOPIN) 0.5 MG tablet   Oral   Take 0.5 mg by mouth 2 (two) times daily as needed for anxiety.         Marland Kitchen FLUoxetine (PROZAC) 20 MG capsule   Oral   Take 20 mg by mouth daily.         . Hydrocodone-Acetaminophen (VICODIN) 5-300 MG TABS   Oral   Take 1 tablet by mouth every 4 (four) hours as needed (pain). Patient not taking: Reported on 11/12/2014   12 each   0   . meloxicam (MOBIC) 15 MG tablet   Oral   Take 15 mg by mouth at bedtime.          . metoCLOPramide (REGLAN) 10 MG tablet   Oral   Take 1 tablet (10 mg total) by mouth every 6 (six) hours as  needed for nausea or vomiting. Patient not taking: Reported on 11/12/2014   12 tablet   1   . ondansetron (ZOFRAN ODT) 8 MG disintegrating tablet   Oral   Take 1 tablet (8 mg total) by mouth every 8 (eight) hours as needed for nausea or vomiting.   20 tablet   0   . ranitidine (ZANTAC) 150 MG capsule   Oral   Take 1 capsule (150 mg total) by mouth 2 (two) times daily.   28 capsule   0   . sucralfate (CARAFATE) 1 G tablet   Oral   Take 1 tablet (1 g total) by mouth 4 (four) times daily.   120 tablet   1     Allergies Motrin  History reviewed. No pertinent family history.  Social History Social History  Substance Use Topics  . Smoking status: Never Smoker   . Smokeless tobacco: None  . Alcohol Use: No    Review of Systems Constitutional: No fever/chills Eyes: No visual changes. ENT: No sore throat. No stiff neck no neck pain Cardiovascular: Denies chest pain. Respiratory: Denies shortness of breath. Gastrointestinal:   no vomiting.  No diarrhea.  No constipation. Genitourinary: Negative for dysuria. Musculoskeletal: Negative lower extremity swelling Skin: Negative for  rash. Neurological: Negative for headaches, focal weakness or numbness. 10-point ROS otherwise negative.  ____________________________________________   PHYSICAL EXAM:  VITAL SIGNS: ED Triage Vitals  Enc Vitals Group     BP 03/30/15 1324 147/85 mmHg     Pulse Rate 03/30/15 1324 111     Resp 03/30/15 1324 18     Temp 03/30/15 1324 98.3 F (36.8 C)     Temp Source 03/30/15 1324 Oral     SpO2 03/30/15 1324 100 %     Weight 03/30/15 1324 180 lb (81.647 kg)     Height 03/30/15 1324 5\' 3"  (1.6 m)     Head Cir --      Peak Flow --      Pain Score 03/30/15 1324 7     Pain Loc --      Pain Edu? --      Excl. in GC? --     Constitutional: Alert and oriented. Well appearing and in no acute distress. Somewhat anxious Eyes: Conjunctivae are normal. PERRL. EOMI. Head: Atraumatic. Nose: No  congestion/rhinnorhea. Mouth/Throat: Mucous membranes are moist.  Oropharynx non-erythematous. Neck: No stridor.   Nontender with no meningismus Cardiovascular: Normal rate, regular rhythm. Grossly normal heart sounds.  Good peripheral circulation. Respiratory: Normal respiratory effort.  No retractions. Lungs CTAB. Abdominal positive tenderness to palpation in the right upper quadrant which reproduces the patient's pain No guarding no rebound Back:  There is no focal tenderness or step off there is no midline tenderness there are no lesions noted. there is minimal right CVA tenderness Musculoskeletal: No lower extremity tenderness. No joint effusions, no DVT signs strong distal pulses no edema Neurologic:  Normal speech and language. No gross focal neurologic deficits are appreciated.  Skin:  Skin is warm, dry and intact. No rash noted. Psychiatric: Mood and affect are anxious and upset. Speech and behavior are normal.  ____________________________________________   LABS (all labs ordered are listed, but only abnormal results are displayed)  Labs Reviewed  CBC - Abnormal; Notable for the following:    RBC 5.43 (*)    All other components within normal limits  URINALYSIS COMPLETEWITH MICROSCOPIC ([facility] ONLY) - Abnormal; Notable for the following:    Color, Urine YELLOW (*)    APPearance HAZY (*)    Hgb urine dipstick 1+ (*)    Leukocytes, UA 1+ (*)    Bacteria, UA RARE (*)    Squamous Epithelial / LPF 6-30 (*)    All other components within normal limits  COMPREHENSIVE METABOLIC PANEL - Abnormal; Notable for the following:    Total Protein 8.7 (*)    All other components within normal limits  LIPASE, BLOOD  POC URINE PREG, ED  POCT PREGNANCY, URINE   ____________________________________________  EKG  I personally interpreted any EKGs ordered by me or triage  ____________________________________________  RADIOLOGY  I reviewed any imaging ordered by me or triage that were  performed during my shift ____________________________________________   PROCEDURES  Procedure(s) performed: None  Critical Care performed: None  ____________________________________________   INITIAL IMPRESSION / ASSESSMENT AND PLAN / ED COURSE  Pertinent labs & imaging results that were available during my care of the patient were reviewed by me and considered in my medical decision making (see chart for details).  Patient with reproducible right upper quadrant pain concerning for possible gallstone. We will check old work and obtain ultrasound and reassess. Review of prior records shows the patient has been having this pain off and on for quite some  time. Although she states that she does have a history of gallstones, we have had ultrasounds really do not show them. Her urinalysis is suggestive of possible UTI. We will give her antibiotics pending culture which we'll obtain. If ultrasound is negative given chronicity of problem, and benign nature of abdominal exam, I do not think further evaluation will be needed with tomographic imaging, patient understands that outpatient follow-up is advised and return precautions and is strongly urged and understood ____________________________________________   FINAL CLINICAL IMPRESSION(S) / ED DIAGNOSES  Final diagnoses:  None      This chart was dictated using voice recognition software.  Despite best efforts to proofread,  errors can occur which can change meaning.     Jeanmarie Plant, MD 03/30/15 1446  Jeanmarie Plant, MD 03/30/15 [phone]

## 2015-03-30 NOTE — ED Notes (Signed)
Pt c/o R sided flank pain. Pt denies injury or urinary changes at this time. Pt presents obviously uncomfortable in triage. Pt denies hx of kidney stones at this time. Pt denies further symptoms at this time.

## 2015-03-30 NOTE — ED Notes (Signed)
Patient transported to Ultrasound 

## 2015-03-30 NOTE — Discharge Instructions (Signed)

## 2015-03-30 NOTE — ED Notes (Signed)
Report to Valerie, RN.

## 2015-03-30 NOTE — ED Notes (Signed)
MD at bedside. 

## 2015-04-01 LAB — URINE CULTURE

## 2015-10-13 ENCOUNTER — Emergency Department
Admission: EM | Admit: 2015-10-13 | Discharge: 2015-10-13 | Disposition: A | Discharge status: Home / Self Care | Payer: Self-pay | Attending: Emergency Medicine | Admitting: Emergency Medicine

## 2015-10-13 ENCOUNTER — Emergency Department: Payer: Self-pay

## 2015-10-13 ENCOUNTER — Encounter: Payer: Self-pay | Admitting: Emergency Medicine

## 2015-10-13 DIAGNOSIS — N390 Urinary tract infection, site not specified: Secondary | ICD-10-CM | POA: Insufficient documentation

## 2015-10-13 DIAGNOSIS — R109 Unspecified abdominal pain: Secondary | ICD-10-CM | POA: Insufficient documentation

## 2015-10-13 LAB — COMPREHENSIVE METABOLIC PANEL
ALBUMIN: 4.5 g/dL (ref 3.5–5.0)
ALT: 30 U/L (ref 14–54)
AST: 27 U/L (ref 15–41)
Alkaline Phosphatase: 46 U/L (ref 38–126)
Anion gap: 6 (ref 5–15)
BILIRUBIN TOTAL: 0.7 mg/dL (ref 0.3–1.2)
BUN: 8 mg/dL (ref 6–20)
CO2: 25 mmol/L (ref 22–32)
Calcium: 9.5 mg/dL (ref 8.9–10.3)
Chloride: 106 mmol/L (ref 101–111)
Creatinine, Ser: 0.65 mg/dL (ref 0.44–1.00)
GFR calc Af Amer: >60 mL/min (ref >60)
GFR calc non Af Amer: >60 mL/min (ref >60)
GLUCOSE: 107 mg/dL — AB (ref 65–99)
POTASSIUM: 3.9 mmol/L (ref 3.5–5.1)
Sodium: 137 mmol/L (ref 135–145)
TOTAL PROTEIN: 8.2 g/dL — AB (ref 6.5–8.1)

## 2015-10-13 LAB — URINALYSIS COMPLETE WITH MICROSCOPIC (ARMC ONLY)
Bilirubin Urine: NEGATIVE
Glucose, UA: NEGATIVE mg/dL
Nitrite: NEGATIVE
PROTEIN: NEGATIVE mg/dL
Specific Gravity, Urine: 1.016 (ref 1.005–1.030)
pH: 5 (ref 5.0–8.0)

## 2015-10-13 LAB — CBC WITH DIFFERENTIAL/PLATELET
BASOS ABS: 0 10*3/uL (ref 0–0.1)
BASOS PCT: 0 %
Eosinophils Absolute: 0 10*3/uL (ref 0–0.7)
Eosinophils Relative: 0 %
HEMATOCRIT: 40.7 % (ref 35.0–47.0)
HEMOGLOBIN: 13.7 g/dL (ref 12.0–16.0)
Lymphocytes Relative: 5 %
Lymphs Abs: 0.8 10*3/uL — ABNORMAL LOW (ref 1.0–3.6)
MCH: 28 pg (ref 26.0–34.0)
MCHC: 33.6 g/dL (ref 32.0–36.0)
MCV: 83.4 fL (ref 80.0–100.0)
MONO ABS: 0.8 10*3/uL (ref 0.2–0.9)
Monocytes Relative: 5 %
NEUTROS ABS: 15 10*3/uL — AB (ref 1.4–6.5)
NEUTROS PCT: 90 %
Platelets: 375 10*3/uL (ref 150–440)
RBC: 4.88 MIL/uL (ref 3.80–5.20)
RDW: 13.4 % (ref 11.5–14.5)
WBC: 16.7 10*3/uL — ABNORMAL HIGH (ref 3.6–11.0)

## 2015-10-13 LAB — POCT PREGNANCY, URINE: PREG TEST UR: NEGATIVE

## 2015-10-13 LAB — FIBRIN DERIVATIVES D-DIMER (ARMC ONLY): Fibrin derivatives D-dimer (ARMC): 475 (ref 0–499)

## 2015-10-13 MED ORDER — SODIUM CHLORIDE 0.9 % IV BOLUS (SEPSIS): 500.0000 mL | Freq: Once | INTRAVENOUS | Status: DC

## 2015-10-13 MED ORDER — MORPHINE SULFATE (PF) 4 MG/ML IV SOLN
4.0000 mg | Freq: Once | INTRAVENOUS | Status: DC
  Filled 2015-10-13: qty 1

## 2015-10-13 MED ORDER — MORPHINE SULFATE (PF) 4 MG/ML IV SOLN
4.0000 mg | Freq: Once | INTRAVENOUS | Status: AC
  Administered 2015-10-13: 4 mg via INTRAMUSCULAR

## 2015-10-13 MED ORDER — KETOROLAC TROMETHAMINE 30 MG/ML IJ SOLN
30.0000 mg | Freq: Once | INTRAMUSCULAR | Status: AC
  Administered 2015-10-13: 30 mg via INTRAMUSCULAR

## 2015-10-13 MED ORDER — ONDANSETRON 4 MG PO TBDP
4.0000 mg | ORAL_TABLET | Freq: Once | ORAL | Status: AC
  Administered 2015-10-13: 4 mg via ORAL

## 2015-10-13 MED ORDER — KETOROLAC TROMETHAMINE 30 MG/ML IJ SOLN
30.0000 mg | Freq: Once | INTRAMUSCULAR | Status: DC
  Filled 2015-10-13: qty 1

## 2015-10-13 MED ORDER — CEPHALEXIN 500 MG PO CAPS: 500.0000 mg | ORAL_CAPSULE | Freq: Four times a day (QID) | ORAL | 0 refills | Status: AC

## 2015-10-13 MED ORDER — ONDANSETRON 4 MG PO TBDP
ORAL_TABLET | ORAL | Status: AC
  Filled 2015-10-13: qty 1

## 2015-10-13 MED ORDER — ONDANSETRON HCL 4 MG/2ML IJ SOLN
4.0000 mg | Freq: Once | INTRAMUSCULAR | Status: DC
  Filled 2015-10-13: qty 2

## 2015-10-13 MED ORDER — SODIUM CHLORIDE 0.9 % IV BOLUS (SEPSIS)
500.0000 mL | Freq: Once | INTRAVENOUS | Status: AC
  Administered 2015-10-13: 500 mL via INTRAVENOUS

## 2015-10-13 MED ORDER — TRAMADOL HCL 50 MG PO TABS: 50.0000 mg | ORAL_TABLET | Freq: Four times a day (QID) | ORAL | 0 refills | Status: AC | PRN
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
## 2015-10-13 NOTE — ED Notes (Signed)
POCT Preg NEG 

## 2015-10-13 NOTE — ED Provider Notes (Signed)
-----------------------------------------   6:26 PM on 10/13/2015 -----------------------------------------  Patient very controlled reversal chest wall pain negative d-dimer, negative CT abdomen and pelvis. Signed out to me by previous physician, with the plan being that if the d-dimer is negative patient to go home. Patient is in no acute distress resting completely in the bed. She has no difficulty breathing to have reproducible chest wall pain with a negative CT scan and chest x-ray and negative d-dimer. Nothing at this time to indicate PE. White count somewhat elevated but no indication at this time of acute infectious process. Possible UTI. We will send her home with antibiotics for this return precautions given and understood. No evidence of pyelonephritis on CT. Do not document any shingles at this time. The patient is not pregnant. She is eager to go home. She would like the IV placed by prior physician taken out and she would like to be discharged.   Jeanmarie PlantJames A Taquan Bralley, MD 10/13/15 [phone]

## 2015-10-13 NOTE — ED Provider Notes (Signed)
[facility] Emergency Department Provider Note  ____________________________________________  Time seen: Approximately 4:21 PM  I have reviewed the triage vital signs and the nursing notes.   HISTORY  Chief Complaint Abdominal Pain and Flank Pain   HPI Alison Mathews is a 30 y.o. female with h/o gallstones who presents for evaluation of Left lower chest pain. Patient reports sudden onset of left side/lower chest wall pain that started yesterday while she was working. She works as a Conservation officer, nature in [employer]. She reports that the pain is pleuritic, severe, constant, worse with palpation of the lateral lower left chest wall. She hasn't tried anything at home for the pain. She denies chest pain, shortness of breath, cough, fever, abdominal pain, nausea, vomiting, hematuria, dysuria, frequency, diarrhea. She reports she's never had pain like this before. She denies personal or family history of blood clots, recent travel or immobilization, leg pain or swelling, hemoptysis, or exogenous hormones. Patient denies history of kidney stones. She reports that she is currently finishing her last menstrual period.  Past Medical History:  Diagnosis Date  . Anxiety   . Gall stones     There are no active problems to display for this patient.   History reviewed. No pertinent surgical history.  Prior to Admission medications   Medication Sig Start Date End Date Taking? Authorizing Provider  cephALEXin (KEFLEX) 500 MG capsule Take 1 capsule (500 mg total) by mouth 4 (four) times daily. 10/13/15 10/23/15  Jeanmarie Plant, MD  clonazePAM (KLONOPIN) 0.5 MG tablet Take 0.5 mg by mouth 2 (two) times daily as needed for anxiety.    Historical Provider, MD  FLUoxetine (PROZAC) 20 MG capsule Take 20 mg by mouth daily.    Historical Provider, MD  meloxicam (MOBIC) 15 MG tablet Take 15 mg by mouth at bedtime.     Historical Provider, MD  metoCLOPramide (REGLAN) 10 MG tablet Take 1  tablet (10 mg total) by mouth every 6 (six) hours as needed for nausea or vomiting. Patient not taking: Reported on 11/12/2014 09/13/14   Myrna Blazer, MD  ondansetron (ZOFRAN ODT) 8 MG disintegrating tablet Take 1 tablet (8 mg total) by mouth every 8 (eight) hours as needed for nausea or vomiting. 11/10/14   Sharman Cheek, MD  ranitidine (ZANTAC) 150 MG capsule Take 1 capsule (150 mg total) by mouth 2 (two) times daily. 11/10/14   Sharman Cheek, MD  sucralfate (CARAFATE) 1 G tablet Take 1 tablet (1 g total) by mouth 4 (four) times daily. 11/10/14   Sharman Cheek, MD  traMADol (ULTRAM) 50 MG tablet Take 1 tablet (50 mg total) by mouth every 6 (six) hours as needed. 10/13/15 10/12/16  Jeanmarie Plant, MD    Allergies Motrin [ibuprofen]  No family history on file.  Social History Social History  Substance Use Topics  . Smoking status: Never Smoker  . Smokeless tobacco: Never Used  . Alcohol use No    Review of Systems  Constitutional: Negative for fever. Eyes: Negative for visual changes. ENT: Negative for sore throat. Cardiovascular: + lower left sided chest wall pain Respiratory: Negative for shortness of breath. Gastrointestinal: Negative for abdominal pain, vomiting or diarrhea. Genitourinary: Negative for dysuria. Musculoskeletal: Negative for back pain. Skin: Negative for rash. Neurological: Negative for headaches, weakness or numbness.  ____________________________________________   PHYSICAL EXAM:  VITAL SIGNS: ED Triage Vitals  Enc Vitals Group     BP 10/13/15 1402 129/88     Pulse Rate 10/13/15 1402 Marland Kitchen)  109     Resp 10/13/15 1402 16     Temp 10/13/15 1402 97.6 F (36.4 C)     Temp Source 10/13/15 1402 Oral     SpO2 10/13/15 1402 99 %     Weight 10/13/15 1403 170 lb (77.1 kg)     Height 10/13/15 1403 5\' 3"  (1.6 m)     Head Circumference --      Peak Flow --      Pain Score 10/13/15 1403 7     Pain Loc --      Pain Edu? --      Excl. in GC? --      Constitutional: Alert and oriented. Well appearing and in no apparent distress. HEENT:      Head: Normocephalic and atraumatic.         Eyes: Conjunctivae are normal. Sclera is non-icteric. EOMI. PERRL      Mouth/Throat: Mucous membranes are moist.       Neck: Supple with no signs of meningismus. Cardiovascular: Tachycardic with regular rhythm. No murmurs, gallops, or rubs. 2+ symmetrical distal pulses are present in all extremities. No JVD. Respiratory: Patient is ttp over the Left lateral lower chest wall. Normal respiratory effort. Lungs are clear to auscultation bilaterally. No wheezes, crackles, or rhonchi.  Gastrointestinal: Soft, non tender, and non distended with positive bowel sounds. No rebound or guarding. Genitourinary: No CVA tenderness. Musculoskeletal: Nontender with normal range of motion in all extremities. No edema, cyanosis, or erythema of extremities. Neurologic: Normal speech and language. Face is symmetric. Moving all extremities. No gross focal neurologic deficits are appreciated. Skin: Skin is warm, dry and intact. No rash noted. Psychiatric: Mood and affect are normal. Speech and behavior are normal.  ____________________________________________   LABS (all labs ordered are listed, but only abnormal results are displayed)  Labs Reviewed  URINALYSIS COMPLETEWITH MICROSCOPIC ([facility] ONLY) - Abnormal; Notable for the following:       Result Value   Color, Urine YELLOW (*)    APPearance HAZY (*)    Ketones, ur 1+ (*)    Hgb urine dipstick 3+ (*)    Leukocytes, UA TRACE (*)    Bacteria, UA RARE (*)    Squamous Epithelial / LPF 6-30 (*)    All other components within normal limits  CBC WITH DIFFERENTIAL/PLATELET - Abnormal; Notable for the following:    WBC 16.7 (*)    Neutro Abs 15.0 (*)    Lymphs Abs 0.8 (*)    All other components within normal limits  COMPREHENSIVE METABOLIC PANEL - Abnormal; Notable for the following:    Glucose, Bld 107 (*)     Total Protein 8.2 (*)    All other components within normal limits  URINE CULTURE  FIBRIN DERIVATIVES D-DIMER ([facility] ONLY)  POC URINE PREG, ED  POCT PREGNANCY, URINE   ____________________________________________  EKG  ED ECG REPORT I, Nita Sicklearolina Rashia Mckesson, the attending physician, personally viewed and interpreted this ECG.  Sinus tachycardia, rate of 107, normal intervals, normal axis, no ST elevations or depressions. ____________________________________________  RADIOLOGY  CXR; Negative   CT renal: Negative ____________________________________________   PROCEDURES  Procedure(s) performed: yes Procedures   US guided IV. Placed on L AC with US guidance. Patient tolerated well.   Critical Care performed:  None ____________________________________________   INITIAL IMPRESSION / ASSESSMENT AND PLAN / ED COURSE  30 y.o. female with h/o gallstones who presents for evaluation of sudden onset of pleuritic Left lower chest pain since yesterday. Patient is  in obvious distress due to pain, she has markeldy tenderness to palpation on the left lower lateral chest wall with no obvious deformities or bruising, her abdomen is soft and nontender, her lungs are clear to auscultation. Patient is tachycardic. EKG showing tachycardia. Chest x-ray negative, CT renal protocol negative for kidney stone. D-dimer is pending to rule out PE. UA with some blood but patient is currently on her menstrual period. UA also showing rare bacteria and trace leuks with 6-30 WBC concerning for possible UTI, however not sure this explains her sudden symptoms. Labs pending. IV morphine, IV Toradol, IV fluids, and IV Zofran prescribed for symptom control. Care transferred to Dr. Alphonzo LemmingsMcShane at 4:30 PM  Clinical Course    Pertinent labs & imaging results that were available during my care of the patient were reviewed by me and considered in my medical decision making (see chart for  details).    ____________________________________________   FINAL CLINICAL IMPRESSION(S) / ED DIAGNOSES  Final diagnoses:  UTI (lower urinary tract infection)  Left chest pain    NEW MEDICATIONS STARTED DURING THIS VISIT:  Discharge Medication List as of 10/13/2015  6:40 PM    START taking these medications   Details  cephALEXin (KEFLEX) 500 MG capsule Take 1 capsule (500 mg total) by mouth 4 (four) times daily., Starting Mon 10/13/2015, Until Thu 10/23/2015, Print         Note:  This document was prepared using Dragon voice recognition software and may include unintentional dictation errors.    Nita Sicklearolina Kalum Minner, MD 10/13/15 2159

## 2015-10-15 LAB — URINE CULTURE

## 2015-10-24 MED ORDER — POTASSIUM CHLORIDE CRYS ER 20 MEQ PO TBCR
EXTENDED_RELEASE_TABLET | ORAL | Status: AC
  Filled 2015-10-24: qty 2

## 2016-08-08 ENCOUNTER — Emergency Department: Payer: Self-pay

## 2016-08-08 ENCOUNTER — Emergency Department
Admission: EM | Admit: 2016-08-08 | Discharge: 2016-08-08 | Disposition: A | Discharge status: Home / Self Care | Payer: Self-pay | Attending: Student in an Organized Health Care Education/Training Program | Admitting: Student in an Organized Health Care Education/Training Program

## 2016-08-08 DIAGNOSIS — R109 Unspecified abdominal pain: Secondary | ICD-10-CM

## 2016-08-08 DIAGNOSIS — Z79899 Other long term (current) drug therapy: Secondary | ICD-10-CM | POA: Insufficient documentation

## 2016-08-08 DIAGNOSIS — Z791 Long term (current) use of non-steroidal anti-inflammatories (NSAID): Secondary | ICD-10-CM | POA: Insufficient documentation

## 2016-08-08 DIAGNOSIS — R1031 Right lower quadrant pain: Secondary | ICD-10-CM | POA: Insufficient documentation

## 2016-08-08 LAB — COMPREHENSIVE METABOLIC PANEL
ALK PHOS: 51 U/L (ref 38–126)
ALT: 20 U/L (ref 14–54)
AST: 23 U/L (ref 15–41)
Albumin: 4.5 g/dL (ref 3.5–5.0)
Anion gap: 5 (ref 5–15)
BILIRUBIN TOTAL: 0.4 mg/dL (ref 0.3–1.2)
BUN: 12 mg/dL (ref 6–20)
CALCIUM: 9.9 mg/dL (ref 8.9–10.3)
CO2: 29 mmol/L (ref 22–32)
Chloride: 108 mmol/L (ref 101–111)
Creatinine, Ser: 0.74 mg/dL (ref 0.44–1.00)
Glucose, Bld: 91 mg/dL (ref 65–99)
Potassium: 3.9 mmol/L (ref 3.5–5.1)
Sodium: 142 mmol/L (ref 135–145)
TOTAL PROTEIN: 8.5 g/dL — AB (ref 6.5–8.1)

## 2016-08-08 LAB — URINALYSIS, COMPLETE (UACMP) WITH MICROSCOPIC
Bacteria, UA: NONE SEEN
Bilirubin Urine: NEGATIVE
GLUCOSE, UA: NEGATIVE mg/dL
Ketones, ur: 5 mg/dL — AB
Nitrite: NEGATIVE
PH: 5 (ref 5.0–8.0)
Protein, ur: 30 mg/dL — AB
Specific Gravity, Urine: 1.029 (ref 1.005–1.030)

## 2016-08-08 LAB — CBC
HCT: 44.1 % (ref 35.0–47.0)
Hemoglobin: 14.5 g/dL (ref 12.0–16.0)
MCH: 28 pg (ref 26.0–34.0)
MCHC: 33 g/dL (ref 32.0–36.0)
MCV: 84.8 fL (ref 80.0–100.0)
PLATELETS: 401 10*3/uL (ref 150–440)
RBC: 5.19 MIL/uL (ref 3.80–5.20)
RDW: 13.4 % (ref 11.5–14.5)
WBC: 10.9 10*3/uL (ref 3.6–11.0)

## 2016-08-08 LAB — POCT PREGNANCY, URINE: Preg Test, Ur: NEGATIVE

## 2016-08-08 LAB — LIPASE, BLOOD: Lipase: 28 U/L (ref 11–51)

## 2016-08-08 MED ORDER — PROMETHAZINE HCL 12.5 MG PO TABS: 12.5000 mg | ORAL_TABLET | Freq: Four times a day (QID) | ORAL | 0 refills | Status: DC | PRN

## 2016-08-08 MED ORDER — NAPROXEN 500 MG PO TABS: 500.0000 mg | ORAL_TABLET | Freq: Two times a day (BID) | ORAL | 0 refills | Status: AC

## 2016-08-08 MED ORDER — DICYCLOMINE HCL 10 MG PO CAPS: 10.0000 mg | ORAL_CAPSULE | Freq: Three times a day (TID) | ORAL | 0 refills | Status: DC | PRN

## 2016-08-08 MED ORDER — SODIUM CHLORIDE 0.9 % IV BOLUS (SEPSIS)
1000.0000 mL | Freq: Once | INTRAVENOUS | Status: AC
  Administered 2016-08-08: 1000 mL via INTRAVENOUS

## 2016-08-08 MED ORDER — MORPHINE SULFATE (PF) 4 MG/ML IV SOLN
4.0000 mg | INTRAVENOUS | Status: DC | PRN
Start: 2016-08-08 — End: 2016-08-08
  Administered 2016-08-08: 4 mg via INTRAVENOUS
  Filled 2016-08-08: qty 1

## 2016-08-08 MED ORDER — IOPAMIDOL (ISOVUE-300) INJECTION 61%
100.0000 mL | Freq: Once | INTRAVENOUS | Status: AC | PRN
Start: 2016-08-08 — End: 2016-08-08
  Administered 2016-08-08: 100 mL via INTRAVENOUS

## 2016-08-08 MED ORDER — PROMETHAZINE HCL 25 MG/ML IJ SOLN
12.5000 mg | Freq: Once | INTRAMUSCULAR | Status: AC
  Administered 2016-08-08: 12.5 mg via INTRAVENOUS
  Filled 2016-08-08: qty 1

## 2016-08-08 NOTE — ED Provider Notes (Signed)
[facility] Emergency Department Provider Note    None    (approximate)  I have reviewed the triage vital signs and the nursing notes.   HISTORY  Chief Complaint Abdominal Pain    HPI Alison Mathews is a 31 y.o. female history gallstones presents with acute right lower quadrant abdominal pain and right flank pain that started last night. No associated nausea vomiting or diarrhea. Denies any chest pain or shortness of breath. No measured fevers but was at work today and was feeling very poorly so she came in to be checked out. States that she's never had pain like this before. Denies any dysuria or increased urinary frequency. No diarrhea. Is not currently on her menstrual cycle. Denies any vaginal bleeding or discharge.   Past Medical History:  Diagnosis Date  . Anxiety   . Gall stones    FamHx: no bleeding disorders History reviewed. No pertinent surgical history. There are no active problems to display for this patient.     Prior to Admission medications   Medication Sig Start Date End Date Taking? Authorizing Provider  clonazePAM (KLONOPIN) 0.5 MG tablet Take 0.5 mg by mouth 2 (two) times daily as needed for anxiety.    [provider]  FLUoxetine (PROZAC) 20 MG capsule Take 20 mg by mouth daily.    [provider]  meloxicam (MOBIC) 15 MG tablet Take 15 mg by mouth at bedtime.     [provider]  metoCLOPramide (REGLAN) 10 MG tablet Take 1 tablet (10 mg total) by mouth every 6 (six) hours as needed for nausea or vomiting. Patient not taking: Reported on 11/12/2014 09/13/14   Myrna Blazer, MD  ondansetron (ZOFRAN ODT) 8 MG disintegrating tablet Take 1 tablet (8 mg total) by mouth every 8 (eight) hours as needed for nausea or vomiting. 11/10/14   Sharman Cheek, MD  ranitidine (ZANTAC) 150 MG capsule Take 1 capsule (150 mg total) by mouth 2 (two) times daily. 11/10/14   Sharman Cheek, MD  sucralfate  (CARAFATE) 1 G tablet Take 1 tablet (1 g total) by mouth 4 (four) times daily. 11/10/14   Sharman Cheek, MD  traMADol (ULTRAM) 50 MG tablet Take 1 tablet (50 mg total) by mouth every 6 (six) hours as needed. 10/13/15 10/12/16  Jeanmarie Plant, MD    Allergies Motrin [ibuprofen]    Social History Social History  Substance Use Topics  . Smoking status: Never Smoker  . Smokeless tobacco: Never Used  . Alcohol use No    Review of Systems Patient denies headaches, rhinorrhea, blurry vision, numbness, shortness of breath, chest pain, edema, cough, abdominal pain, nausea, vomiting, diarrhea, dysuria, fevers, rashes or hallucinations unless otherwise stated above in HPI. ____________________________________________   PHYSICAL EXAM:  VITAL SIGNS: Vitals:   08/08/16 1416  BP: (!) 137/92  Pulse: 98  Resp: 18  Temp: 97.8 F (36.6 C)    Constitutional: Alert and oriented. Well appearing and in no acute distress. Eyes: Conjunctivae are normal.  Head: Atraumatic. Nose: No congestion/rhinnorhea. Mouth/Throat: Mucous membranes are moist.   Neck: No stridor. Painless ROM.  Cardiovascular: Normal rate, regular rhythm. Grossly normal heart sounds.  Good peripheral circulation. Respiratory: Normal respiratory effort.  No retractions. Lungs CTAB. Gastrointestinal: Soft and nontender. No distention. No abdominal bruits. No CVA tenderness. Musculoskeletal: No lower extremity tenderness nor edema.  No joint effusions. Neurologic:  Normal speech and language. No gross focal neurologic deficits are appreciated. No facial droop Skin:  Skin is  warm, dry and intact. No rash noted. Psychiatric: Mood and affect are normal. Speech and behavior are normal.  ____________________________________________   LABS (all labs ordered are listed, but only abnormal results are displayed)  Results for orders placed or performed during the hospital encounter of 08/08/16 (from the past 24 hour(s))  Lipase,  blood     Status: None   Collection Time: 08/08/16  2:14 PM  Result Value Ref Range   Lipase 28 11 - 51 U/L  Comprehensive metabolic panel     Status: Abnormal   Collection Time: 08/08/16  2:14 PM  Result Value Ref Range   Sodium 142 135 - 145 mmol/L   Potassium 3.9 3.5 - 5.1 mmol/L   Chloride 108 101 - 111 mmol/L   CO2 29 22 - 32 mmol/L   Glucose, Bld 91 65 - 99 mg/dL   BUN 12 6 - 20 mg/dL   Creatinine, Ser 1.61 0.44 - 1.00 mg/dL   Calcium 9.9 8.9 - 09.6 mg/dL   Total Protein 8.5 (H) 6.5 - 8.1 g/dL   Albumin 4.5 3.5 - 5.0 g/dL   AST 23 15 - 41 U/L   ALT 20 14 - 54 U/L   Alkaline Phosphatase 51 38 - 126 U/L   Total Bilirubin 0.4 0.3 - 1.2 mg/dL   GFR calc non Af Amer >60 >60 mL/min   GFR calc Af Amer >60 >60 mL/min   Anion gap 5 5 - 15  CBC     Status: None   Collection Time: 08/08/16  2:14 PM  Result Value Ref Range   WBC 10.9 3.6 - 11.0 K/uL   RBC 5.19 3.80 - 5.20 MIL/uL   Hemoglobin 14.5 12.0 - 16.0 g/dL   HCT 04.5 40.9 - 81.1 %   MCV 84.8 80.0 - 100.0 fL   MCH 28.0 26.0 - 34.0 pg   MCHC 33.0 32.0 - 36.0 g/dL   RDW 91.4 78.2 - 95.6 %   Platelets 401 150 - 440 K/uL  Urinalysis, Complete w Microscopic     Status: Abnormal   Collection Time: 08/08/16  2:15 PM  Result Value Ref Range   Color, Urine YELLOW (A) YELLOW   APPearance CLOUDY (A) CLEAR   Specific Gravity, Urine 1.029 1.005 - 1.030   pH 5.0 5.0 - 8.0   Glucose, UA NEGATIVE NEGATIVE mg/dL   Hgb urine dipstick MODERATE (A) NEGATIVE   Bilirubin Urine NEGATIVE NEGATIVE   Ketones, ur 5 (A) NEGATIVE mg/dL   Protein, ur 30 (A) NEGATIVE mg/dL   Nitrite NEGATIVE NEGATIVE   Leukocytes, UA LARGE (A) NEGATIVE   RBC / HPF TOO NUMEROUS TO COUNT 0 - 5 RBC/hpf   WBC, UA TOO NUMEROUS TO COUNT 0 - 5 WBC/hpf   Bacteria, UA NONE SEEN NONE SEEN   Squamous Epithelial / LPF TOO NUMEROUS TO COUNT (A) NONE SEEN   Mucous PRESENT   Pregnancy, urine POC     Status: None   Collection Time: 08/08/16  2:43 PM  Result Value Ref Range    Preg Test, Ur NEGATIVE NEGATIVE   ____________________________________________ ____________________________________________  RADIOLOGY  I personally reviewed all radiographic images ordered to evaluate for the above acute complaints and reviewed radiology reports and findings.  These findings were personally discussed with the patient.  Please see medical record for radiology report.  ____________________________________________   PROCEDURES  Procedure(s) performed:  Procedures    Critical Care performed: no ____________________________________________   INITIAL IMPRESSION / ASSESSMENT AND PLAN / ED  COURSE  Pertinent labs & imaging results that were available during my care of the patient were reviewed by me and considered in my medical decision making (see chart for details).  DDX: stone, appendicitis, colitis, pyelo, toa,   Lequita Haltashia D Coralee NorthClapp is a 31 y.o. who presents to the ED with right flank and abdominal pain as described above. Patient not in any acute distress.   Based on presentation will order ct imaging to evaluate for the above differential.  Lab work is reassuring.  The patient will be placed on continuous pulse oximetry and telemetry for monitoring.  Laboratory evaluation will be sent to evaluate for the above complaints.     Clinical Course as of Aug 08 1941  Sun Aug 08, 2016  1706 Symptoms have improved. CT imaging is unremarkable. Repeat abdominal exam is soft and benign. We'll trial PO challenge.  [PR]    Clinical Course User Index [PR] Willy Eddyobinson, Anabela Crayton, MD   Patient was able to tolerate PO and was able to ambulate with a steady gait.  Have discussed with the patient and available family all diagnostics and treatments performed thus far and all questions were answered to the best of my ability. The patient demonstrates understanding and agreement with plan.    ____________________________________________   FINAL CLINICAL IMPRESSION(S) / ED  DIAGNOSES  Final diagnoses:  Right flank pain      NEW MEDICATIONS STARTED DURING THIS VISIT:  New Prescriptions   No medications on file     Note:  This document was prepared using Dragon voice recognition software and may include unintentional dictation errors.    Willy Eddyobinson, Aniza Shor, MD 08/11/16 1356

## 2016-08-08 NOTE — ED Notes (Signed)
Pt given water, tolerated well

## 2016-08-08 NOTE — ED Notes (Signed)
Given report to Girard[place], RN

## 2016-08-08 NOTE — ED Triage Notes (Signed)
Pt reports to ED w/ c/o R abd pain that radiates to back. Pt denies N/V/D, CP, SOB, or urinary s/s. Pt A/OX4, resp even and unlabored.  Pt sts abd pain started last night. NAD.

## 2016-08-08 NOTE — Discharge Instructions (Signed)

## 2016-08-10 LAB — URINE CULTURE

## 2017-01-31 IMAGING — CR DG CHEST 2V
1 series · 3 of 3 positions shown · non-contrast
Comparison: PA and lateral chest 11/25/2012 and 04/18/2014.

CLINICAL DATA: Acute onset left chest and side pain today.

EXAM:
CHEST  2 VIEW

[Series 1: dg chest 2 view · 0.14mm/px · 3 of 3 slices shown]
[im 1/3]
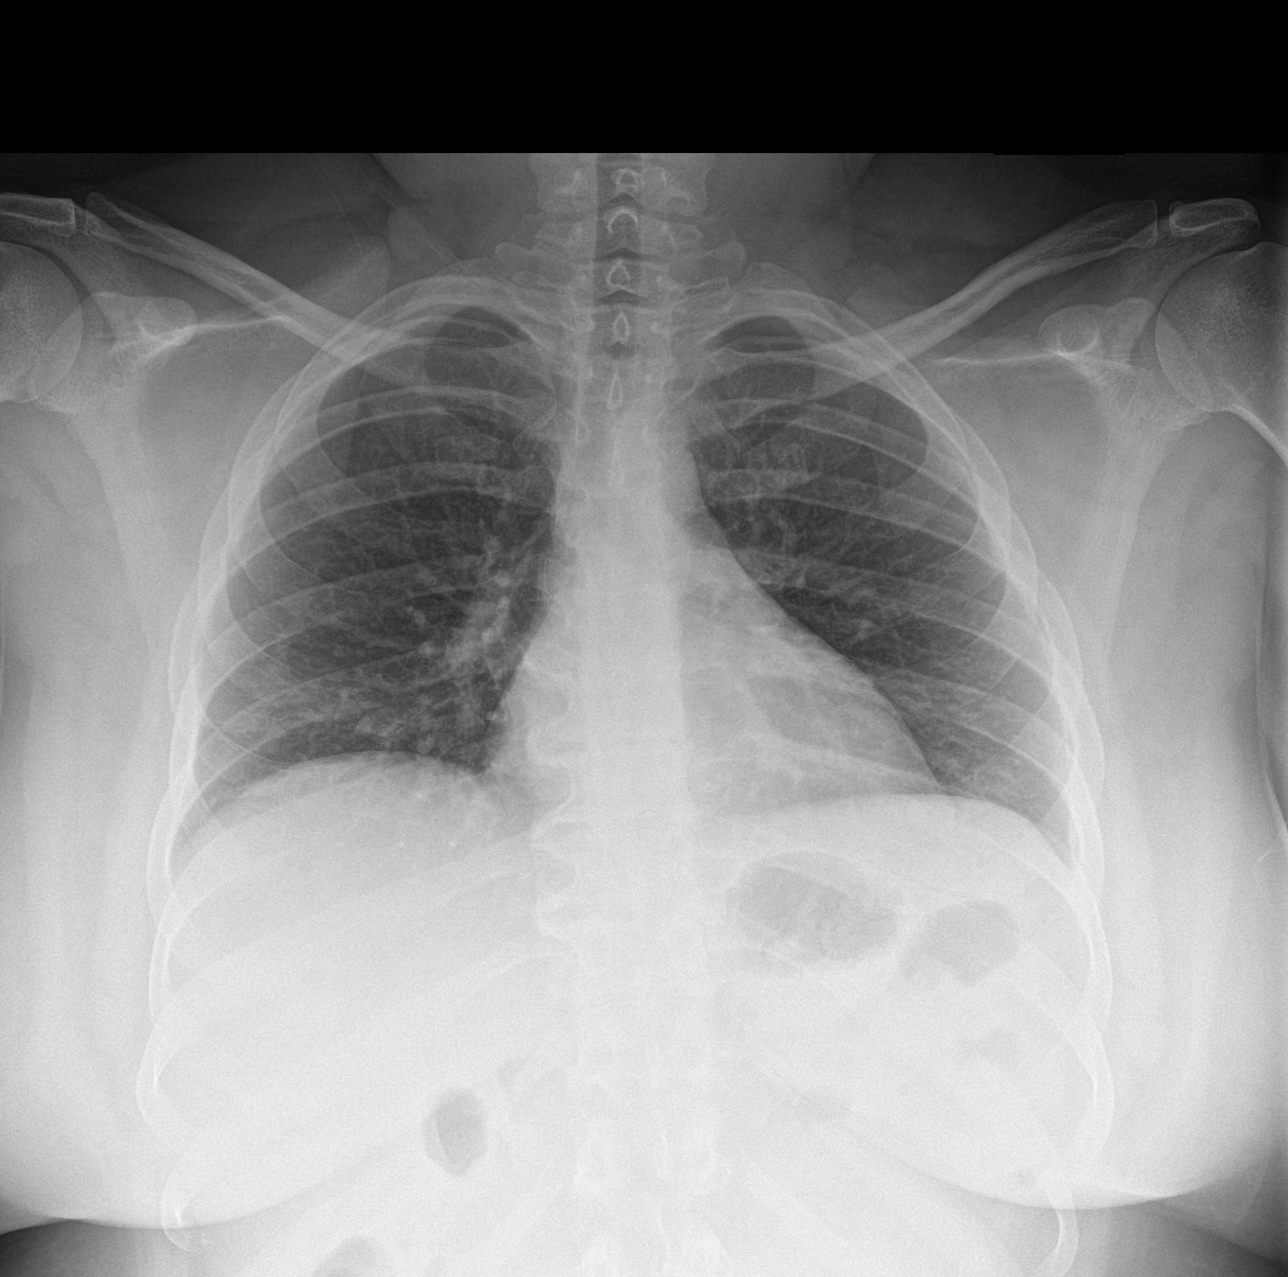
[im 2/3]
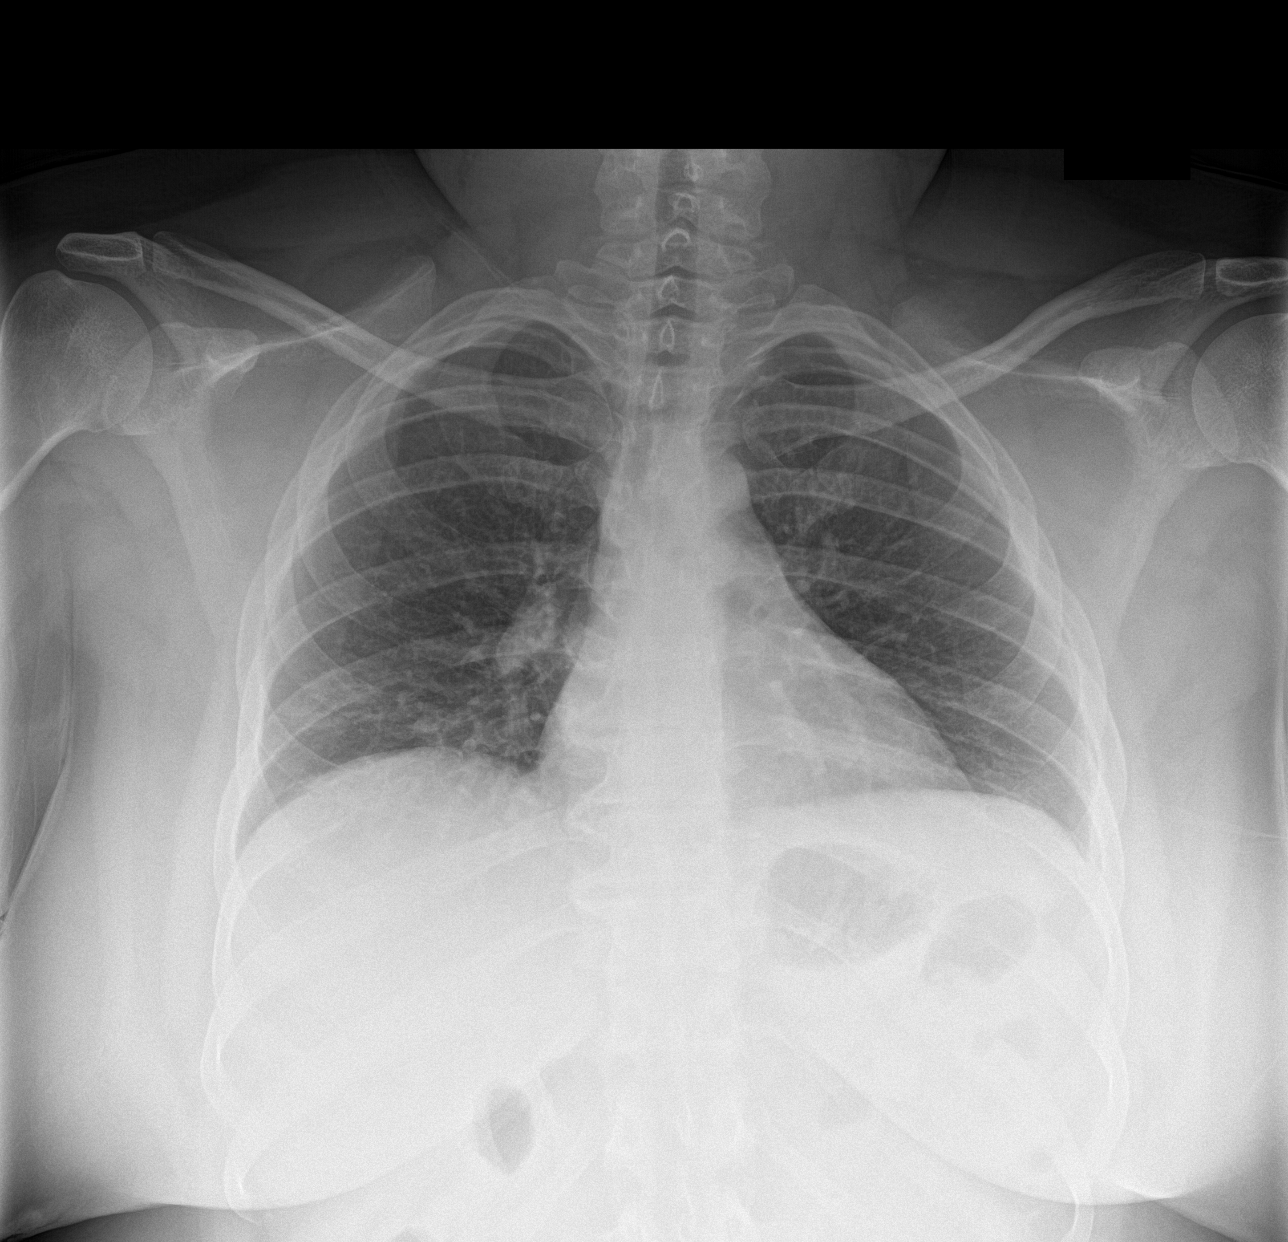
[im 3/3]
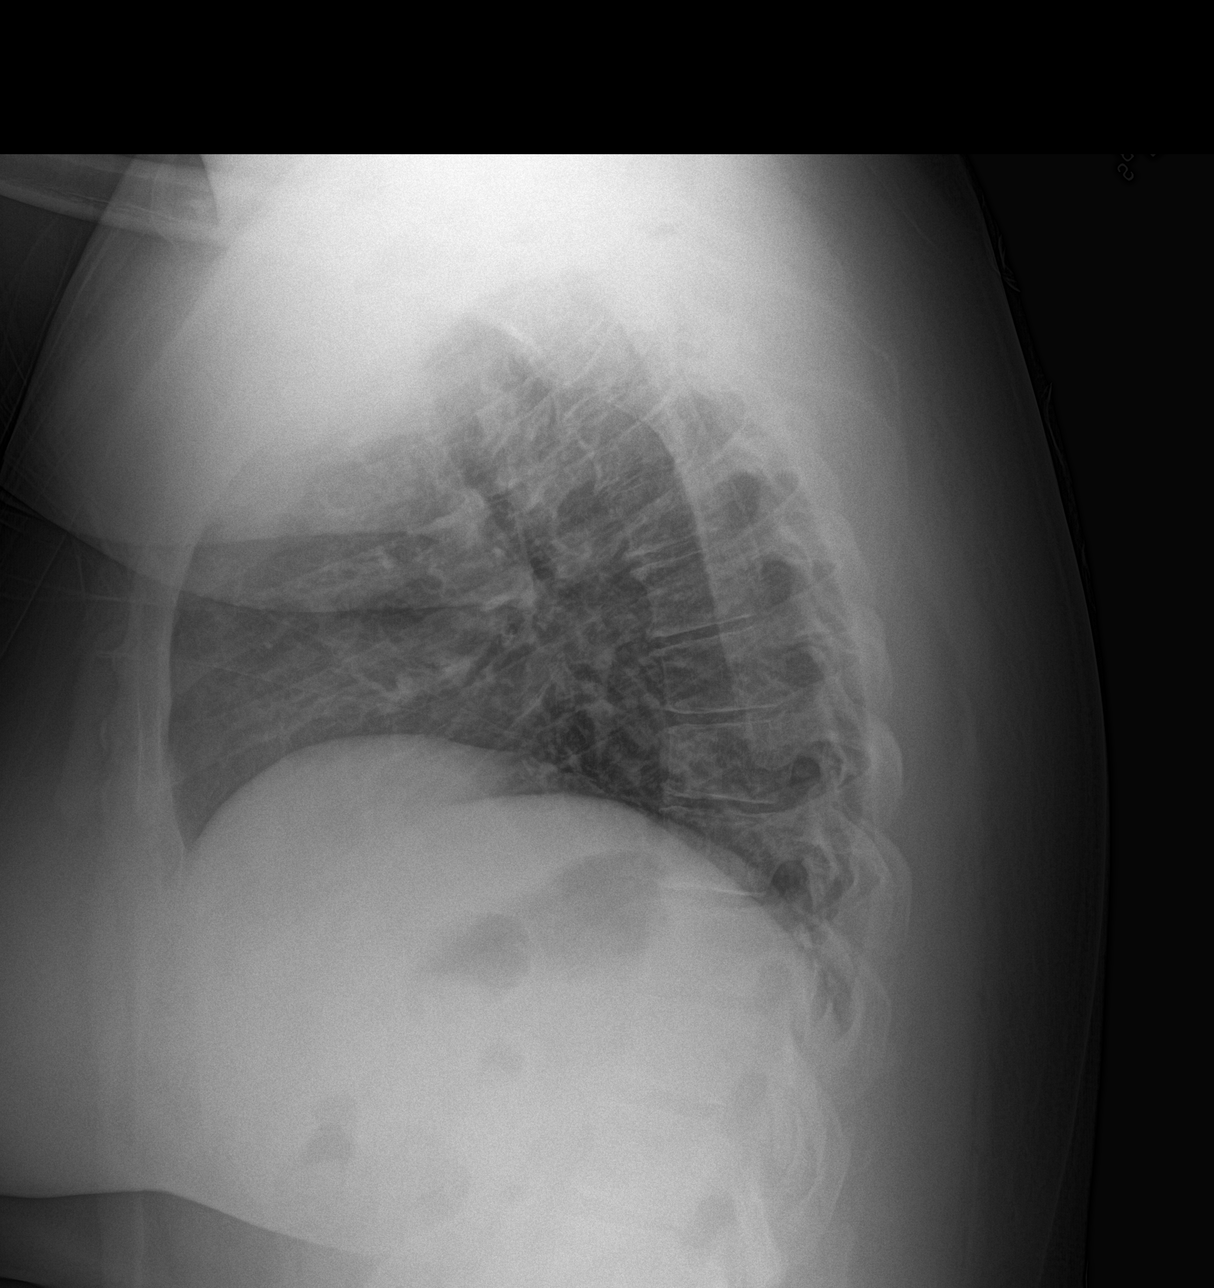

[3 of 3 positions shown; findings below may reference images not displayed]

FINDINGS: The lungs are clear. Heart size is normal. There is no pneumothorax
or pleural effusion. No bony abnormality is identified.
IMPRESSION: Negative chest.

## 2017-03-29 ENCOUNTER — Emergency Department
Admission: EM | Admit: 2017-03-29 | Discharge: 2017-03-29 | Disposition: A | Discharge status: Home / Self Care | Payer: Self-pay | Attending: Emergency Medicine | Admitting: Emergency Medicine

## 2017-03-29 ENCOUNTER — Other Ambulatory Visit: Payer: Self-pay

## 2017-03-29 ENCOUNTER — Emergency Department: Payer: Self-pay

## 2017-03-29 DIAGNOSIS — F419 Anxiety disorder, unspecified: Secondary | ICD-10-CM | POA: Insufficient documentation

## 2017-03-29 DIAGNOSIS — R1011 Right upper quadrant pain: Secondary | ICD-10-CM | POA: Insufficient documentation

## 2017-03-29 DIAGNOSIS — Z79899 Other long term (current) drug therapy: Secondary | ICD-10-CM | POA: Insufficient documentation

## 2017-03-29 LAB — URINALYSIS, COMPLETE (UACMP) WITH MICROSCOPIC
BILIRUBIN URINE: NEGATIVE
Bacteria, UA: NONE SEEN
Glucose, UA: NEGATIVE mg/dL
Hgb urine dipstick: NEGATIVE
Ketones, ur: NEGATIVE mg/dL
NITRITE: NEGATIVE
PH: 5 (ref 5.0–8.0)
Protein, ur: NEGATIVE mg/dL
SPECIFIC GRAVITY, URINE: 1.034 — AB (ref 1.005–1.030)

## 2017-03-29 LAB — COMPREHENSIVE METABOLIC PANEL
ALK PHOS: 50 U/L (ref 38–126)
ALT: 28 U/L (ref 14–54)
ANION GAP: 9 (ref 5–15)
AST: 29 U/L (ref 15–41)
Albumin: 4.5 g/dL (ref 3.5–5.0)
BILIRUBIN TOTAL: 0.1 mg/dL — AB (ref 0.3–1.2)
BUN: 14 mg/dL (ref 6–20)
CALCIUM: 9 mg/dL (ref 8.9–10.3)
CO2: 24 mmol/L (ref 22–32)
CREATININE: 0.58 mg/dL (ref 0.44–1.00)
Chloride: 108 mmol/L (ref 101–111)
GFR calc non Af Amer: >60 mL/min (ref >60)
GLUCOSE: 81 mg/dL (ref 65–99)
Potassium: 3.9 mmol/L (ref 3.5–5.1)
Sodium: 141 mmol/L (ref 135–145)
TOTAL PROTEIN: 7.9 g/dL (ref 6.5–8.1)

## 2017-03-29 LAB — CBC
HEMATOCRIT: 41.4 % (ref 35.0–47.0)
Hemoglobin: 13.7 g/dL (ref 12.0–16.0)
MCH: 28 pg (ref 26.0–34.0)
MCHC: 33.1 g/dL (ref 32.0–36.0)
MCV: 84.8 fL (ref 80.0–100.0)
PLATELETS: 401 10*3/uL (ref 150–440)
RBC: 4.88 MIL/uL (ref 3.80–5.20)
RDW: 13.3 % (ref 11.5–14.5)
WBC: 11.7 10*3/uL — ABNORMAL HIGH (ref 3.6–11.0)

## 2017-03-29 LAB — LIPASE, BLOOD: Lipase: 44 U/L (ref 11–51)

## 2017-03-29 LAB — POCT PREGNANCY, URINE: Preg Test, Ur: NEGATIVE

## 2017-03-29 MED ORDER — HYDROCODONE-ACETAMINOPHEN 5-325 MG PO TABS
2.0000 | ORAL_TABLET | Freq: Once | ORAL | Status: AC
  Administered 2017-03-29: 2 via ORAL
  Filled 2017-03-29: qty 2

## 2017-03-29 MED ORDER — ONDANSETRON 4 MG PO TBDP: 4.0000 mg | ORAL_TABLET | Freq: Three times a day (TID) | ORAL | 0 refills | Status: DC | PRN

## 2017-03-29 MED ORDER — HYDROCODONE-ACETAMINOPHEN 5-325 MG PO TABS: 1.0000 | ORAL_TABLET | ORAL | 0 refills | Status: DC | PRN

## 2017-03-29 MED ORDER — ONDANSETRON 4 MG PO TBDP
4.0000 mg | ORAL_TABLET | Freq: Once | ORAL | Status: AC
  Administered 2017-03-29: 4 mg via ORAL
  Filled 2017-03-29: qty 1

## 2017-03-29 NOTE — ED Provider Notes (Signed)
[facility] Emergency Department Provider Note  Time seen: 7:12 PM  I have reviewed the triage vital signs and the nursing notes.   HISTORY  Chief Complaint Abdominal Pain    HPI Alison Mathews is a 32 y.o. female with a past medical history of anxiety, gallstones, presents to the emergency department for right upper quadrant abdominal pain.  Patient states for the past few months she has been expensing intermittent pain in the right upper quadrant, for the past 48 hours she has had fairly constant pain in the right upper quadrant.  States she has been able to eat and drink but denies any increased pain with eating or drinking.  States some nausea but denies vomiting.  Denies diarrhea.  Denies dysuria or hematuria.  Describes her abdominal pain is a 6 or 7/10 currently, aching type pain.   Past Medical History:  Diagnosis Date  . Anxiety   . Gall stones     There are no active problems to display for this patient.   History reviewed. No pertinent surgical history.  Prior to Admission medications   Medication Sig Start Date End Date Taking? Authorizing Provider  clonazePAM (KLONOPIN) 0.5 MG tablet Take 0.5 mg by mouth 2 (two) times daily as needed for anxiety.    [provider]  dicyclomine (BENTYL) 10 MG capsule Take 1 capsule (10 mg total) by mouth 3 (three) times daily as needed for spasms. 08/08/16 08/22/16  Willy Eddy, MD  FLUoxetine (PROZAC) 20 MG capsule Take 20 mg by mouth daily.    [provider]  meloxicam (MOBIC) 15 MG tablet Take 15 mg by mouth at bedtime.     [provider]  metoCLOPramide (REGLAN) 10 MG tablet Take 1 tablet (10 mg total) by mouth every 6 (six) hours as needed for nausea or vomiting. Patient not taking: Reported on 11/12/2014 09/13/14   Myrna Blazer, MD  naproxen (NAPROSYN) 500 MG tablet Take 1 tablet (500 mg total) by mouth 2 (two) times daily with a meal. 08/08/16 08/08/17  Willy Eddy, MD  ondansetron (ZOFRAN ODT) 8 MG disintegrating tablet Take 1 tablet (8 mg total) by mouth every 8 (eight) hours as needed for nausea or vomiting. Patient not taking: Reported on 08/08/2016 11/10/14   Sharman Cheek, MD  promethazine (PHENERGAN) 12.5 MG tablet Take 1 tablet (12.5 mg total) by mouth every 6 (six) hours as needed for nausea or vomiting. 08/08/16   Willy Eddy, MD  ranitidine (ZANTAC) 150 MG capsule Take 1 capsule (150 mg total) by mouth 2 (two) times daily. Patient not taking: Reported on 08/08/2016 11/10/14   Sharman Cheek, MD  sucralfate (CARAFATE) 1 G tablet Take 1 tablet (1 g total) by mouth 4 (four) times daily. Patient not taking: Reported on 08/08/2016 11/10/14   Sharman Cheek, MD    Allergies  Allergen Reactions  . Motrin [Ibuprofen] Nausea And Vomiting    No family history on file.  Social History Social History   Tobacco Use  . Smoking status: Never Smoker  . Smokeless tobacco: Never Used  Substance Use Topics  . Alcohol use: No  . Drug use: No    Review of Systems Constitutional: Negative for fever. Eyes: Negative for visual complaints ENT: Negative for recent illness/congestion Cardiovascular: Negative for chest pain. Respiratory: Negative for shortness of breath. Gastrointestinal: Right upper quadrant abdominal pain.  Positive for nausea.  Negative for vomiting or diarrhea Genitourinary: Negative for hematuria or dysuria. Musculoskeletal: Negative for musculoskeletal complaints  Skin: Negative for skin complaints  Neurological: Negative for headache All other ROS negative  ____________________________________________   PHYSICAL EXAM:  VITAL SIGNS: ED Triage Vitals  Enc Vitals Group     BP 03/29/17 1728 (!) 145/89     Pulse Rate 03/29/17 1728 95     Resp 03/29/17 1728 18     Temp 03/29/17 1728 98 F (36.7 C)     Temp Source 03/29/17 1728 Oral     SpO2 03/29/17 1728 100 %     Weight 03/29/17 1729 175 lb (79.4 kg)      Height 03/29/17 1729 5\' 3"  (1.6 m)     Head Circumference --      Peak Flow --      Pain Score 03/29/17 1728 7     Pain Loc --      Pain Edu? --      Excl. in GC? --     Constitutional: Alert and oriented. Well appearing and in no distress. Eyes: Normal exam ENT   Head: Normocephalic and atraumatic.   Mouth/Throat: Mucous membranes are moist. Cardiovascular: Normal rate, regular rhythm. No murmur Respiratory: Normal respiratory effort without tachypnea nor retractions. Breath sounds are clear and equal bilaterally. No wheezes/rales/rhonchi. Gastrointestinal: Soft, moderate right upper quadrant tenderness, slight diffuse abdominal tenderness although the patient states that is chronic.  No rebound or guarding.  No distention. Musculoskeletal: Nontender with normal range of motion in all extremities.  Neurologic:  Normal speech and language. No gross focal neurologic deficits Psychiatric: Mood and affect are normal.  ____________________________________________   RADIOLOGY  Ultrasound negative  ____________________________________________   INITIAL IMPRESSION / ASSESSMENT AND PLAN / ED COURSE  Pertinent labs & imaging results that were available during my care of the patient were reviewed by me and considered in my medical decision making (see chart for details).  Patient presents the emergency department for right upper quadrant abdominal pain intermittent times months but constant for the past 2 days.  Overall the patient appears well, no distress, mild to moderate right upper quadrant tenderness.  Patient does have very slight diffuse tenderness which she states is chronic and no change from baseline besides the right upper quadrant pain.  States she was told in the past she had gallstones but still has her gallbladder.  Denies any fever.  States nausea but denies vomiting.  Differential would include biliary colic, cholecystitis, cholelithiasis, pancreatitis, gastritis.   Patient's labs have resulted largely within normal limits, very slight leukocytosis 11,700, normal lipase, normal LFTs, normal urinalysis.  We will proceed with a right upper quadrant ultrasound to further evaluate, patient agreeable to plan of care.  Ultrasound largely within normal limits.  Patient still states mild to moderate discomfort we will treat the patient's nausea and pain and reassess.  Overall the patient appears well, no distress.  And feeling much better after medication.  We will discharge patient home with GI follow-up short course of medication.  Patient agreeable to plan.  ____________________________________________   FINAL CLINICAL IMPRESSION(S) / ED DIAGNOSES  Right upper quadrant abdominal pain    Minna AntisPaduchowski, Grainger Mccarley, MD 03/29/17 2041

## 2017-03-29 NOTE — ED Notes (Signed)
Recollect lav tube sent to lab

## 2017-03-29 NOTE — ED Notes (Signed)
This RN received call from HoraceEmily sec need for redraw of lavender

## 2017-03-29 NOTE — ED Notes (Signed)
4 attempts for lab draw. Sent lavender and green top

## 2017-03-29 NOTE — ED Triage Notes (Signed)
Pt c/o RUQ pain with nausea for the past 2 days. Denies vomiting or diarrhea.

## 2017-03-29 NOTE — ED Notes (Signed)
First nurse note.. States she is having right side abd pain

## 2017-06-23 ENCOUNTER — Emergency Department
Admission: EM | Admit: 2017-06-23 | Discharge: 2017-06-23 | Disposition: A | Discharge status: Home / Self Care | Payer: Self-pay | Attending: Emergency Medicine | Admitting: Emergency Medicine

## 2017-06-23 ENCOUNTER — Other Ambulatory Visit: Payer: Self-pay

## 2017-06-23 ENCOUNTER — Encounter: Payer: Self-pay | Admitting: Emergency Medicine

## 2017-06-23 DIAGNOSIS — Z79899 Other long term (current) drug therapy: Secondary | ICD-10-CM | POA: Insufficient documentation

## 2017-06-23 DIAGNOSIS — R109 Unspecified abdominal pain: Secondary | ICD-10-CM

## 2017-06-23 DIAGNOSIS — G8929 Other chronic pain: Secondary | ICD-10-CM | POA: Insufficient documentation

## 2017-06-23 DIAGNOSIS — R1084 Generalized abdominal pain: Secondary | ICD-10-CM | POA: Insufficient documentation

## 2017-06-23 LAB — URINALYSIS, COMPLETE (UACMP) WITH MICROSCOPIC
Bilirubin Urine: NEGATIVE
Glucose, UA: NEGATIVE mg/dL
Hgb urine dipstick: NEGATIVE
KETONES UR: NEGATIVE mg/dL
Leukocytes, UA: NEGATIVE
Nitrite: NEGATIVE
PH: 5 (ref 5.0–8.0)
Protein, ur: NEGATIVE mg/dL
Specific Gravity, Urine: 1.03 (ref 1.005–1.030)

## 2017-06-23 LAB — CBC
HEMATOCRIT: 42.8 % (ref 35.0–47.0)
Hemoglobin: 14.2 g/dL (ref 12.0–16.0)
MCH: 28.4 pg (ref 26.0–34.0)
MCHC: 33.2 g/dL (ref 32.0–36.0)
MCV: 85.5 fL (ref 80.0–100.0)
Platelets: 375 10*3/uL (ref 150–440)
RBC: 5.01 MIL/uL (ref 3.80–5.20)
RDW: 13.7 % (ref 11.5–14.5)
WBC: 7.9 10*3/uL (ref 3.6–11.0)

## 2017-06-23 LAB — COMPREHENSIVE METABOLIC PANEL
ALBUMIN: 4.3 g/dL (ref 3.5–5.0)
ALT: 19 U/L (ref 14–54)
AST: 21 U/L (ref 15–41)
Alkaline Phosphatase: 45 U/L (ref 38–126)
Anion gap: 7 (ref 5–15)
BUN: 8 mg/dL (ref 6–20)
CHLORIDE: 106 mmol/L (ref 101–111)
CO2: 27 mmol/L (ref 22–32)
CREATININE: 0.69 mg/dL (ref 0.44–1.00)
Calcium: 9.1 mg/dL (ref 8.9–10.3)
GFR calc Af Amer: >60 mL/min (ref >60)
GFR calc non Af Amer: >60 mL/min (ref >60)
GLUCOSE: 89 mg/dL (ref 65–99)
Potassium: 3.9 mmol/L (ref 3.5–5.1)
SODIUM: 140 mmol/L (ref 135–145)
Total Bilirubin: 0.4 mg/dL (ref 0.3–1.2)
Total Protein: 8 g/dL (ref 6.5–8.1)

## 2017-06-23 LAB — LIPASE, BLOOD: Lipase: 36 U/L (ref 11–51)

## 2017-06-23 LAB — POCT PREGNANCY, URINE: PREG TEST UR: NEGATIVE

## 2017-06-23 MED ORDER — CEFDINIR 300 MG PO CAPS: 300.0000 mg | ORAL_CAPSULE | Freq: Two times a day (BID) | ORAL | Status: DC

## 2017-06-23 MED ORDER — GI COCKTAIL ~~LOC~~
30.0000 mL | Freq: Once | ORAL | Status: AC
  Administered 2017-06-23: 30 mL via ORAL
  Filled 2017-06-23: qty 30

## 2017-06-23 MED ORDER — FUROSEMIDE 40 MG PO TABS: 40.0000 mg | ORAL_TABLET | Freq: Once | ORAL | Status: DC

## 2017-06-23 NOTE — ED Notes (Signed)
Pt c/o intermittent right lower abdominal pain that began again last night. Pt describes her pain as sharp. Pt denies n/v and diarrhea.

## 2017-06-23 NOTE — ED Provider Notes (Signed)
[facility] Emergency Department Provider Note  ____________________________________________   I have reviewed the triage vital signs and the nursing notes. Where available I have reviewed prior notes and, if possible and indicated, outside hospital notes.    HISTORY  Chief Complaint Abdominal Pain    HPI Alison Mathews is a 32 y.o. female  who presents today complaining of abdominal pain for 2 years.  She states she always has right upper quadrant abdominal pain.  Seems worse when she is stressed or anxious.  She has been seen multiple times by this department for this.  Going back to 2016, she has had 4- right upper quadrant ultrasounds, as well as 2- CT scans for this pain.  She has she states yet to follow-up with her primary care doctor but she does have appointment in a week or 2 with the pain is worse.  She denies any lower abdominal pain she denies any dysuria urinary frequency, she denies pregnancy, and her last menstruation was 2 weeks ago, no vaginal discharge, same pain she always has, she has it on a daily basis.  Times it seems worse with food.  No weight loss,    Past Medical History:  Diagnosis Date  . Anxiety   . Gall stones     There are no active problems to display for this patient.   History reviewed. No pertinent surgical history.  Prior to Admission medications   Medication Sig Start Date End Date Taking? Authorizing Provider  clonazePAM (KLONOPIN) 0.5 MG tablet Take 0.5 mg by mouth 2 (two) times daily as needed for anxiety.    [provider]  dicyclomine (BENTYL) 10 MG capsule Take 1 capsule (10 mg total) by mouth 3 (three) times daily as needed for spasms. 08/08/16 08/22/16  Willy Eddy, MD  FLUoxetine (PROZAC) 20 MG capsule Take 20 mg by mouth daily.    [provider]  HYDROcodone-acetaminophen (NORCO/VICODIN) 5-325 MG tablet Take 1 tablet by mouth every 4 (four) hours as needed. 03/29/17   Minna Antis,  MD  meloxicam (MOBIC) 15 MG tablet Take 15 mg by mouth at bedtime.     [provider]  metoCLOPramide (REGLAN) 10 MG tablet Take 1 tablet (10 mg total) by mouth every 6 (six) hours as needed for nausea or vomiting. Patient not taking: Reported on 11/12/2014 09/13/14   Myrna Blazer, MD  naproxen (NAPROSYN) 500 MG tablet Take 1 tablet (500 mg total) by mouth 2 (two) times daily with a meal. 08/08/16 08/08/17  Willy Eddy, MD  ondansetron (ZOFRAN ODT) 4 MG disintegrating tablet Take 1 tablet (4 mg total) by mouth every 8 (eight) hours as needed for nausea or vomiting. 03/29/17   Minna Antis, MD  promethazine (PHENERGAN) 12.5 MG tablet Take 1 tablet (12.5 mg total) by mouth every 6 (six) hours as needed for nausea or vomiting. 08/08/16   Willy Eddy, MD  ranitidine (ZANTAC) 150 MG capsule Take 1 capsule (150 mg total) by mouth 2 (two) times daily. Patient not taking: Reported on 08/08/2016 11/10/14   Sharman Cheek, MD  sucralfate (CARAFATE) 1 G tablet Take 1 tablet (1 g total) by mouth 4 (four) times daily. Patient not taking: Reported on 08/08/2016 11/10/14   Sharman Cheek, MD    Allergies Motrin [ibuprofen]  No family history on file.  Social History Social History   Tobacco Use  . Smoking status: Never Smoker  . Smokeless tobacco: Never Used  Substance Use Topics  . Alcohol use: No  .  Drug use: No    Review of Systems Constitutional: No fever/chills Eyes: No visual changes. ENT: No sore throat. No stiff neck no neck pain Cardiovascular: Denies chest pain. Respiratory: Denies shortness of breath. Gastrointestinal:   no vomiting.  No diarrhea.  No constipation. Genitourinary: Negative for dysuria. Musculoskeletal: Negative lower extremity swelling Skin: Negative for rash. Neurological: Negative for severe headaches, focal weakness or numbness.   ____________________________________________   PHYSICAL EXAM:  VITAL SIGNS: ED Triage  Vitals  Enc Vitals Group     BP 06/23/17 1400 127/81     Pulse Rate 06/23/17 1400 87     Resp 06/23/17 1400 14     Temp 06/23/17 1400 98.7 F (37.1 C)     Temp Source 06/23/17 1400 Oral     SpO2 06/23/17 1400 100 %     Weight 06/23/17 1401 180 lb (81.6 kg)     Height 06/23/17 1401  (1.6 m)     Head Circumference --      Peak Flow --      Pain Score 06/23/17 1401 7     Pain Loc --      Pain Edu? --      Excl. in GC? --     Constitutional: Alert and oriented. Well appearing and in no acute distress. Eyes: Conjunctivae are normal Head: Atraumatic HEENT: No congestion/rhinnorhea. Mucous membranes are moist.  Oropharynx non-erythematous Neck:   Nontender with no meningismus, no masses, no stridor Cardiovascular: Normal rate, regular rhythm. Grossly normal heart sounds.  Good peripheral circulation. Respiratory: Normal respiratory effort.  No retractions. Lungs CTAB. Abdominal: Soft and nontender. No distention. No guarding no rebound Back:  There is no focal tenderness or step off.  there is no midline tenderness there are no lesions noted. there is no CVA tenderness Musculoskeletal: No lower extremity tenderness, no upper extremity tenderness. No joint effusions, no DVT signs strong distal pulses no edema Neurologic:  Normal speech and language. No gross focal neurologic deficits are appreciated.  Skin:  Skin is warm, dry and intact. No rash noted. Psychiatric: Mood and affect are anxious. Speech and behavior are normal.  ____________________________________________   LABS (all labs ordered are listed, but only abnormal results are displayed)  Labs Reviewed  URINALYSIS, COMPLETE (UACMP) WITH MICROSCOPIC - Abnormal; Notable for the following components:      Result Value   Color, Urine YELLOW (*)    APPearance HAZY (*)    Bacteria, UA FEW (*)    All other components within normal limits  LIPASE, BLOOD  COMPREHENSIVE METABOLIC PANEL  CBC  POCT PREGNANCY, URINE  POC  URINE PREG, ED    Pertinent labs  results that were available during my care of the patient were reviewed by me and considered in my medical decision making (see chart for details). ____________________________________________  EKG  I personally interpreted any EKGs ordered by me or triage  ____________________________________________  RADIOLOGY  Pertinent labs & imaging results that were available during my care of the patient were reviewed by me and considered in my medical decision making (see chart for details). If possible, patient and/or family made aware of any abnormal findings.  No results found. ____________________________________________    PROCEDURES  Procedure(s) performed: None  Procedures  Critical Care performed: None  ____________________________________________   INITIAL IMPRESSION / ASSESSMENT AND PLAN / ED COURSE  Pertinent labs & imaging results that were available during my care of the patient were reviewed by me and considered in my medical  decision making (see chart for details).  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  Tried to explain the patient is not likely will be able to solve the problem we are certainly happy to look.  I do not think repeated imaging is indicated.  She may require an outpatient HIDA scan but if liver function tests are normal given 2 years of pain I do not think any acute interventions is likely to be indicated at this time.  We will try to get her into see GI medicine if I can    ____________________________________________   FINAL CLINICAL IMPRESSION(S) / ED DIAGNOSES  Final diagnoses:  None      This chart was dictated using voice recognition software.  Despite best efforts to proofread,  errors can occur which can change meaning.      Jeanmarie Plant, MD 06/23/17 [phone]

## 2017-06-23 NOTE — ED Triage Notes (Signed)
Right mid abd pain since last night sharp pain. No problems with bms or urination

## 2017-06-23 NOTE — Discharge Instructions (Addendum)
return to the emergency room for any new or worrisome symptoms including change in her chronic pain, fever, vomiting, diarrhea, or any worsening symptoms. Follow closely with primary care doctor as well as the GI specialist, he may suggest to the GI specialist that the ER doctor thought a HIDA scan might be appropriate if he agrees.

## 2017-06-23 NOTE — ED Notes (Signed)
Topez not working 

## 2017-07-08 ENCOUNTER — Encounter: Payer: Self-pay | Admitting: Emergency Medicine

## 2017-07-08 ENCOUNTER — Other Ambulatory Visit: Payer: Self-pay

## 2017-07-08 ENCOUNTER — Emergency Department: Payer: Self-pay

## 2017-07-08 DIAGNOSIS — S0083XA Contusion of other part of head, initial encounter: Secondary | ICD-10-CM | POA: Insufficient documentation

## 2017-07-08 DIAGNOSIS — Y999 Unspecified external cause status: Secondary | ICD-10-CM | POA: Insufficient documentation

## 2017-07-08 DIAGNOSIS — S8002XA Contusion of left knee, initial encounter: Secondary | ICD-10-CM | POA: Insufficient documentation

## 2017-07-08 DIAGNOSIS — Z79899 Other long term (current) drug therapy: Secondary | ICD-10-CM | POA: Insufficient documentation

## 2017-07-08 DIAGNOSIS — M7918 Myalgia, other site: Secondary | ICD-10-CM | POA: Insufficient documentation

## 2017-07-08 DIAGNOSIS — Y929 Unspecified place or not applicable: Secondary | ICD-10-CM | POA: Insufficient documentation

## 2017-07-08 DIAGNOSIS — S8001XA Contusion of right knee, initial encounter: Secondary | ICD-10-CM | POA: Insufficient documentation

## 2017-07-08 DIAGNOSIS — Y939 Activity, unspecified: Secondary | ICD-10-CM | POA: Insufficient documentation
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
## 2017-07-09 ENCOUNTER — Emergency Department
Admission: EM | Admit: 2017-07-09 | Discharge: 2017-07-09 | Disposition: A | Discharge status: Home / Self Care | Payer: Self-pay | Attending: Emergency Medicine | Admitting: Emergency Medicine

## 2017-07-09 ENCOUNTER — Emergency Department: Payer: Self-pay

## 2017-07-09 DIAGNOSIS — S0083XA Contusion of other part of head, initial encounter: Secondary | ICD-10-CM

## 2017-07-09 DIAGNOSIS — M25562 Pain in left knee: Secondary | ICD-10-CM

## 2017-07-09 DIAGNOSIS — T07XXXA Unspecified multiple injuries, initial encounter: Secondary | ICD-10-CM

## 2017-07-09 DIAGNOSIS — M25561 Pain in right knee: Secondary | ICD-10-CM

## 2017-07-09 DIAGNOSIS — M7918 Myalgia, other site: Secondary | ICD-10-CM

## 2017-07-09 MED ORDER — OXYCODONE-ACETAMINOPHEN 5-325 MG PO TABS
2.0000 | ORAL_TABLET | Freq: Once | ORAL | Status: AC
  Administered 2017-07-09: 2 via ORAL
  Filled 2017-07-09: qty 2

## 2017-07-09 MED ORDER — ETODOLAC 200 MG PO CAPS: 200.0000 mg | ORAL_CAPSULE | Freq: Three times a day (TID) | ORAL | 0 refills | Status: DC

## 2017-07-09 MED ORDER — TRAMADOL HCL 50 MG PO TABS: 50.0000 mg | ORAL_TABLET | Freq: Four times a day (QID) | ORAL | 0 refills | Status: DC | PRN

## 2017-07-09 NOTE — Discharge Instructions (Addendum)
Please follow up with your primary care physician for further evaluation °

## 2017-07-09 NOTE — ED Notes (Signed)
This RN reviewed discharge instructions, follow-up care, prescriptions, cryotherapy, and need for elevation with patient. Patient verbalized understanding of all reviewed information.  Patient stable, with no distress noted at this time. 

## 2017-07-09 NOTE — ED Notes (Signed)
ED Provider at bedside. 
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
## 2017-07-21 ENCOUNTER — Other Ambulatory Visit: Payer: Self-pay

## 2017-07-21 ENCOUNTER — Emergency Department
Admission: EM | Admit: 2017-07-21 | Discharge: 2017-07-21 | Disposition: A | Discharge status: Home / Self Care | Payer: Self-pay | Attending: Emergency Medicine | Admitting: Emergency Medicine

## 2017-07-21 ENCOUNTER — Encounter: Payer: Self-pay | Admitting: *Deleted

## 2017-07-21 DIAGNOSIS — F419 Anxiety disorder, unspecified: Secondary | ICD-10-CM | POA: Insufficient documentation

## 2017-07-21 DIAGNOSIS — Z79899 Other long term (current) drug therapy: Secondary | ICD-10-CM | POA: Insufficient documentation

## 2017-07-21 DIAGNOSIS — G8929 Other chronic pain: Secondary | ICD-10-CM | POA: Insufficient documentation

## 2017-07-21 DIAGNOSIS — M25561 Pain in right knee: Secondary | ICD-10-CM | POA: Insufficient documentation

## 2017-07-21 MED ORDER — MELOXICAM 15 MG PO TABS: 15.0000 mg | ORAL_TABLET | Freq: Every day | ORAL | 1 refills | Status: AC

## 2017-07-21 MED ORDER — MELOXICAM 7.5 MG PO TABS
15.0000 mg | ORAL_TABLET | Freq: Every day | ORAL | Status: DC
  Administered 2017-07-21: 15 mg via ORAL
  Filled 2017-07-21: qty 2
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
## 2017-09-12 ENCOUNTER — Other Ambulatory Visit: Payer: Self-pay

## 2017-09-12 DIAGNOSIS — R109 Unspecified abdominal pain: Secondary | ICD-10-CM | POA: Insufficient documentation

## 2017-09-12 DIAGNOSIS — Z79899 Other long term (current) drug therapy: Secondary | ICD-10-CM | POA: Insufficient documentation

## 2017-09-12 NOTE — ED Triage Notes (Signed)
Ptarrives to ED via POV with c/o LLQ abdominal pain that started "a few hours ago". Pt (+) nausea, but denies emesis, diarrhea or fever. Pt denies SHOB or CP.

## 2017-09-13 ENCOUNTER — Encounter: Payer: Self-pay | Admitting: Radiology

## 2017-09-13 ENCOUNTER — Emergency Department: Payer: Self-pay

## 2017-09-13 ENCOUNTER — Emergency Department
Admission: EM | Admit: 2017-09-13 | Discharge: 2017-09-13 | Disposition: A | Discharge status: Home / Self Care | Payer: Self-pay | Attending: Emergency Medicine | Admitting: Emergency Medicine

## 2017-09-13 DIAGNOSIS — R1012 Left upper quadrant pain: Secondary | ICD-10-CM

## 2017-09-13 LAB — URINALYSIS, COMPLETE (UACMP) WITH MICROSCOPIC
BILIRUBIN URINE: NEGATIVE
Glucose, UA: NEGATIVE mg/dL
Hgb urine dipstick: NEGATIVE
KETONES UR: NEGATIVE mg/dL
Nitrite: NEGATIVE
PROTEIN: 30 mg/dL — AB
Specific Gravity, Urine: 1.038 — ABNORMAL HIGH (ref 1.005–1.030)
pH: 6 (ref 5.0–8.0)

## 2017-09-13 LAB — COMPREHENSIVE METABOLIC PANEL
ALT: 18 U/L (ref 0–44)
AST: 31 U/L (ref 15–41)
Albumin: 4.2 g/dL (ref 3.5–5.0)
Alkaline Phosphatase: 43 U/L (ref 38–126)
Anion gap: 7 (ref 5–15)
BILIRUBIN TOTAL: 0.5 mg/dL (ref 0.3–1.2)
BUN: 16 mg/dL (ref 6–20)
CHLORIDE: 108 mmol/L (ref 98–111)
CO2: 27 mmol/L (ref 22–32)
Calcium: 9.3 mg/dL (ref 8.9–10.3)
Creatinine, Ser: 0.86 mg/dL (ref 0.44–1.00)
GFR calc Af Amer: >60 mL/min (ref >60)
GFR calc non Af Amer: >60 mL/min (ref >60)
GLUCOSE: 124 mg/dL — AB (ref 70–99)
POTASSIUM: 3.4 mmol/L — AB (ref 3.5–5.1)
Sodium: 142 mmol/L (ref 135–145)
Total Protein: 7.7 g/dL (ref 6.5–8.1)

## 2017-09-13 LAB — POCT PREGNANCY, URINE: PREG TEST UR: NEGATIVE

## 2017-09-13 LAB — CBC
HEMATOCRIT: 39.7 % (ref 35.0–47.0)
Hemoglobin: 13.2 g/dL (ref 12.0–16.0)
MCH: 28.3 pg (ref 26.0–34.0)
MCHC: 33.3 g/dL (ref 32.0–36.0)
MCV: 85 fL (ref 80.0–100.0)
Platelets: 389 10*3/uL (ref 150–440)
RBC: 4.67 MIL/uL (ref 3.80–5.20)
RDW: 14.2 % (ref 11.5–14.5)
WBC: 11.7 10*3/uL — AB (ref 3.6–11.0)

## 2017-09-13 LAB — LIPASE, BLOOD: LIPASE: 47 U/L (ref 11–51)

## 2017-09-13 MED ORDER — FAMOTIDINE 20 MG PO TABS
40.0000 mg | ORAL_TABLET | Freq: Once | ORAL | Status: AC
  Administered 2017-09-13: 40 mg via ORAL
  Filled 2017-09-13: qty 2

## 2017-09-13 MED ORDER — ONDANSETRON HCL 4 MG/2ML IJ SOLN
4.0000 mg | Freq: Once | INTRAMUSCULAR | Status: AC
  Administered 2017-09-13: 4 mg via INTRAVENOUS

## 2017-09-13 MED ORDER — ONDANSETRON HCL 4 MG/2ML IJ SOLN: 4.0000 mg | Freq: Once | INTRAMUSCULAR | Status: DC

## 2017-09-13 MED ORDER — FAMOTIDINE 40 MG PO TABS: 40.0000 mg | ORAL_TABLET | Freq: Every evening | ORAL | 0 refills | Status: DC

## 2017-09-13 MED ORDER — ONDANSETRON HCL 4 MG/2ML IJ SOLN
4.0000 mg | Freq: Once | INTRAMUSCULAR | Status: AC
  Administered 2017-09-13: 4 mg via INTRAVENOUS
  Filled 2017-09-13: qty 2

## 2017-09-13 MED ORDER — GI COCKTAIL ~~LOC~~
30.0000 mL | Freq: Once | ORAL | Status: AC
  Administered 2017-09-13: 30 mL via ORAL
  Filled 2017-09-13: qty 30

## 2017-09-13 MED ORDER — MORPHINE SULFATE (PF) 4 MG/ML IV SOLN
4.0000 mg | Freq: Once | INTRAVENOUS | Status: AC
  Administered 2017-09-13: 4 mg via INTRAVENOUS

## 2017-09-13 MED ORDER — MORPHINE SULFATE (PF) 4 MG/ML IV SOLN: 4.0000 mg | Freq: Once | INTRAVENOUS | Status: DC

## 2017-09-13 MED ORDER — IOPAMIDOL (ISOVUE-300) INJECTION 61%
100.0000 mL | Freq: Once | INTRAVENOUS | Status: AC | PRN
  Administered 2017-09-13: 100 mL via INTRAVENOUS

## 2017-09-13 MED ORDER — SUCRALFATE 1 G PO TABS: 1.0000 g | ORAL_TABLET | Freq: Two times a day (BID) | ORAL | 0 refills | Status: DC

## 2017-09-13 MED ORDER — SODIUM CHLORIDE 0.9 % IV BOLUS
1000.0000 mL | Freq: Once | INTRAVENOUS | Status: AC
  Administered 2017-09-13: 1000 mL via INTRAVENOUS

## 2017-09-13 NOTE — Discharge Instructions (Addendum)
Please follow up with your primary care physician and GI for further evaluation of your abdominal pain.

## 2017-09-13 NOTE — ED Notes (Addendum)
Pt uprite on stretcher in exam room with no distress noted; reports onset left lower quad abd pain this evening accomp by nausea; denies urinary, vag, or bowel c/o; st hx abd "issues" and has an endoscopy sched for next month; +BS, abd soft/nondist with some discomfort on palpation of lower abd

## 2017-09-13 NOTE — ED Provider Notes (Signed)
[facility] Emergency Department Provider Note   ____________________________________________   First MD Initiated Contact with Patient 09/13/17 0105     (approximate)  I have reviewed the triage vital signs and the nursing notes.   HISTORY  Chief Complaint Abdominal Pain    HPI Alison Mathews is a 32 y.o. female who comes into the hospital today with some left-sided abdominal pain.  The patient states that the pain started earlier today.  She thought that it would stop but it has persisted.  The pain is in her mid to lower abdomen.  She states that she is never had this before.  She has had pain on her right in the past but not on the left.  The patient did not take any medicine for pain at home.  She rates her pain a 6 out of 10 in intensity.  She denies any fevers, nausea, vomiting, diarrhea or constipation.  Her last menstrual period was 2 weeks ago.  The patient denies any vaginal bleeding or discharge.  She also denies any pain with urination but has had some occasional back pain.  She decided to come into the hospital today for evaluation of this abdominal pain.   Past Medical History:  Diagnosis Date  . Anxiety   . Gall stones     There are no active problems to display for this patient.   History reviewed. No pertinent surgical history.  Prior to Admission medications   Medication Sig Start Date End Date Taking? Authorizing Provider  clonazePAM (KLONOPIN) 0.5 MG tablet Take 0.5 mg by mouth 2 (two) times daily as needed for anxiety.    [provider]  dicyclomine (BENTYL) 10 MG capsule Take 1 capsule (10 mg total) by mouth 3 (three) times daily as needed for spasms. 08/08/16 08/22/16  Willy Eddy, MD  etodolac (LODINE) 200 MG capsule Take 1 capsule (200 mg total) by mouth every 8 (eight) hours. 07/09/17   Rebecka Apley, MD  famotidine (PEPCID) 40 MG tablet Take 1 tablet (40 mg total) by mouth every evening. 09/13/17 09/13/18   Rebecka Apley, MD  FLUoxetine (PROZAC) 20 MG capsule Take 20 mg by mouth daily.    [provider]  HYDROcodone-acetaminophen (NORCO/VICODIN) 5-325 MG tablet Take 1 tablet by mouth every 4 (four) hours as needed. 03/29/17   Minna Antis, MD  metoCLOPramide (REGLAN) 10 MG tablet Take 1 tablet (10 mg total) by mouth every 6 (six) hours as needed for nausea or vomiting. Patient not taking: Reported on 11/12/2014 09/13/14   Myrna Blazer, MD  ondansetron (ZOFRAN ODT) 4 MG disintegrating tablet Take 1 tablet (4 mg total) by mouth every 8 (eight) hours as needed for nausea or vomiting. 03/29/17   Minna Antis, MD  promethazine (PHENERGAN) 12.5 MG tablet Take 1 tablet (12.5 mg total) by mouth every 6 (six) hours as needed for nausea or vomiting. 08/08/16   Willy Eddy, MD  ranitidine (ZANTAC) 150 MG capsule Take 1 capsule (150 mg total) by mouth 2 (two) times daily. Patient not taking: Reported on 08/08/2016 11/10/14   Sharman Cheek, MD  sucralfate (CARAFATE) 1 G tablet Take 1 tablet (1 g total) by mouth 4 (four) times daily. Patient not taking: Reported on 08/08/2016 11/10/14   Sharman Cheek, MD  sucralfate (CARAFATE) 1 g tablet Take 1 tablet (1 g total) by mouth 2 (two) times daily. 09/13/17   Rebecka Apley, MD  traMADol (ULTRAM) 50 MG tablet Take 1 tablet (50  mg total) by mouth every 6 (six) hours as needed. 07/09/17   Rebecka Apley, MD    Allergies Motrin [ibuprofen]  No family history on file.  Social History Social History   Tobacco Use  . Smoking status: Never Smoker  . Smokeless tobacco: Never Used  Substance Use Topics  . Alcohol use: No  . Drug use: No    Review of Systems  Constitutional: No fever/chills Eyes: No visual changes. ENT: No sore throat. Cardiovascular: Denies chest pain. Respiratory: Denies shortness of breath. Gastrointestinal:  abdominal pain, nausea, no vomiting.  No diarrhea.  No constipation. Genitourinary:  Negative for dysuria. Musculoskeletal: Occasional back pain. Skin: Negative for rash. Neurological: Negative for headaches, focal weakness or numbness.   ____________________________________________   PHYSICAL EXAM:  VITAL SIGNS: ED Triage Vitals  Enc Vitals Group     BP 09/12/17 2356 124/76     Pulse Rate 09/12/17 2356 97     Resp 09/12/17 2356 18     Temp 09/12/17 2356 98.4 F (36.9 C)     Temp Source 09/12/17 2356 Oral     SpO2 09/12/17 2356 99 %     Weight 09/12/17 2353 180 lb (81.6 kg)     Height 09/12/17 2353 5\' 2"  (1.575 m)     Head Circumference --      Peak Flow --      Pain Score 09/12/17 2353 6     Pain Loc --      Pain Edu? --      Excl. in GC? --     Constitutional: Alert and oriented. Well appearing and in mild distress. Eyes: Conjunctivae are normal. PERRL. EOMI. Head: Atraumatic. Nose: No congestion/rhinnorhea. Mouth/Throat: Mucous membranes are moist.  Oropharynx non-erythematous. Cardiovascular: Normal rate, regular rhythm. Grossly normal heart sounds.  Good peripheral circulation. Respiratory: Normal respiratory effort.  No retractions. Lungs CTAB. Gastrointestinal: Soft with some left upper quadrant abdominal pain to palpation. No distention.  Positive bowel sounds Musculoskeletal: No lower extremity tenderness nor edema.   Neurologic:  Normal speech and language. Skin:  Skin is warm, dry and intact.  Psychiatric: Mood and affect are normal.   ____________________________________________   LABS (all labs ordered are listed, but only abnormal results are displayed)  Labs Reviewed  COMPREHENSIVE METABOLIC PANEL - Abnormal; Notable for the following components:      Result Value   Potassium 3.4 (*)    Glucose, Bld 124 (*)    All other components within normal limits  CBC - Abnormal; Notable for the following components:   WBC 11.7 (*)    All other components within normal limits  URINALYSIS, COMPLETE (UACMP) WITH MICROSCOPIC - Abnormal;  Notable for the following components:   Color, Urine YELLOW (*)    APPearance HAZY (*)    Specific Gravity, Urine 1.038 (*)    Protein, ur 30 (*)    Leukocytes, UA TRACE (*)    Bacteria, UA RARE (*)    All other components within normal limits  LIPASE, BLOOD  POC URINE PREG, ED  POCT PREGNANCY, URINE   ____________________________________________  EKG  ED ECG REPORT I, Rebecka Apley, the attending physician, personally viewed and interpreted this ECG.   Date: 09/12/2017  EKG Time: 2356  Rate: 94  Rhythm: normal sinus rhythm  Axis: normal  Intervals:none  ST&T Change: flipped t waves in lead III, t wave flattening in lead V4, V5, V6  ____________________________________________  RADIOLOGY  ED MD interpretation: CT abdomen and pelvis:  No acute process demonstrated in the abdomen or pelvis  Official radiology report(s): Ct Abdomen Pelvis W Contrast  Result Date: 09/13/2017 CLINICAL DATA:  Left lower quadrant abdominal pain starting a few hours ago. Nausea. EXAM: CT ABDOMEN AND PELVIS WITH CONTRAST TECHNIQUE: Multidetector CT imaging of the abdomen and pelvis was performed using the standard protocol following bolus administration of intravenous contrast. CONTRAST:  100mL ISOVUE-300 IOPAMIDOL (ISOVUE-300) INJECTION 61% COMPARISON:  08/08/2016 FINDINGS: Lower chest: Lung bases are clear. Hepatobiliary: No focal liver abnormality is seen. No gallstones, gallbladder wall thickening, or biliary dilatation. Pancreas: Unremarkable. No pancreatic ductal dilatation or surrounding inflammatory changes. Spleen: Normal in size without focal abnormality. Adrenals/Urinary Tract: Adrenal glands are unremarkable. Kidneys are normal, without renal calculi, focal lesion, or hydronephrosis. Bladder is unremarkable. Stomach/Bowel: Stomach is within normal limits. Appendix appears normal. No evidence of bowel wall thickening, distention, or inflammatory changes. Vascular/Lymphatic: No significant  vascular findings are present. No enlarged abdominal or pelvic lymph nodes. Reproductive: Uterus and bilateral adnexa are unremarkable. Other: No abdominal wall hernia or abnormality. No abdominopelvic ascites. Musculoskeletal: No acute or significant osseous findings. IMPRESSION: No acute process demonstrated in the abdomen or pelvis. Electronically Signed   By: Burman NievesWilliam  Stevens M.D.   On: 09/13/2017 03:37    ____________________________________________   PROCEDURES  Procedure(s) performed: None  Procedures  Critical Care performed: No  ____________________________________________   INITIAL IMPRESSION / ASSESSMENT AND PLAN / ED COURSE  As part of my medical decision making, I reviewed the following data within the electronic MEDICAL RECORD NUMBER Notes from prior ED visits and [place] Controlled Substance Database   This is a 32 year old female who comes into the hospital today with some left upper quadrant abdominal pain.  The patient states that she has had this pain throughout the day and thought it would improve.  I did initially give the patient a GI cocktail while I was checking some blood work to include a CBC CMP lipase urinalysis.  The patient has a white blood cell count of 11.4 but her blood work is unremarkable otherwise.  I did decide to send the patient for a CT scan looking for possible cause of her pain but the CT was negative.  She did receive a GI cocktail as well as some morphine.  I did relate the results of the patient and informed her that she should follow-up with her primary care physician as well as gastroenterology.  The patient agrees with the plan.  She will be discharged home as her pain is improved and she should follow-up.      ____________________________________________   FINAL CLINICAL IMPRESSION(S) / ED DIAGNOSES  Final diagnoses:  Left upper quadrant pain     ED Discharge Orders        Ordered    famotidine (PEPCID) 40 MG tablet  Every evening      09/13/17 0421    sucralfate (CARAFATE) 1 g tablet  2 times daily     09/13/17 0421       Note:  This document was prepared using Dragon voice recognition software and may include unintentional dictation errors.    Rebecka ApleyWebster, Valeriano Bain P, MD 09/13/17 [id number]

## 2017-11-30 ENCOUNTER — Encounter: Payer: Self-pay | Admitting: Emergency Medicine

## 2017-11-30 ENCOUNTER — Emergency Department: Payer: Self-pay

## 2017-11-30 ENCOUNTER — Emergency Department
Admission: EM | Admit: 2017-11-30 | Discharge: 2017-11-30 | Disposition: A | Discharge status: Home / Self Care | Payer: Self-pay | Attending: Student in an Organized Health Care Education/Training Program | Admitting: Student in an Organized Health Care Education/Training Program

## 2017-11-30 DIAGNOSIS — R1012 Left upper quadrant pain: Secondary | ICD-10-CM | POA: Insufficient documentation

## 2017-11-30 DIAGNOSIS — Z79899 Other long term (current) drug therapy: Secondary | ICD-10-CM | POA: Insufficient documentation

## 2017-11-30 LAB — COMPREHENSIVE METABOLIC PANEL
ALBUMIN: 4.5 g/dL (ref 3.5–5.0)
ALK PHOS: 49 U/L (ref 38–126)
ALT: 29 U/L (ref 0–44)
AST: 27 U/L (ref 15–41)
Anion gap: 6 (ref 5–15)
BILIRUBIN TOTAL: 0.2 mg/dL — AB (ref 0.3–1.2)
BUN: 9 mg/dL (ref 6–20)
CALCIUM: 9.7 mg/dL (ref 8.9–10.3)
CO2: 30 mmol/L (ref 22–32)
Chloride: 104 mmol/L (ref 98–111)
Creatinine, Ser: 0.67 mg/dL (ref 0.44–1.00)
GFR calc Af Amer: >60 mL/min (ref >60)
GFR calc non Af Amer: >60 mL/min (ref >60)
GLUCOSE: 94 mg/dL (ref 70–99)
Potassium: 4.6 mmol/L (ref 3.5–5.1)
Sodium: 140 mmol/L (ref 135–145)
TOTAL PROTEIN: 8.3 g/dL — AB (ref 6.5–8.1)

## 2017-11-30 LAB — URINALYSIS, COMPLETE (UACMP) WITH MICROSCOPIC
BILIRUBIN URINE: NEGATIVE
GLUCOSE, UA: NEGATIVE mg/dL
Hgb urine dipstick: NEGATIVE
KETONES UR: NEGATIVE mg/dL
Nitrite: NEGATIVE
PH: 6 (ref 5.0–8.0)
PROTEIN: NEGATIVE mg/dL
Specific Gravity, Urine: 1.017 (ref 1.005–1.030)

## 2017-11-30 LAB — CBC
HCT: 47.3 % — ABNORMAL HIGH (ref 36.0–46.0)
HEMOGLOBIN: 15.2 g/dL — AB (ref 12.0–15.0)
MCH: 28.3 pg (ref 26.0–34.0)
MCHC: 32.1 g/dL (ref 30.0–36.0)
MCV: 87.9 fL (ref 80.0–100.0)
PLATELETS: 378 10*3/uL (ref 150–400)
RBC: 5.38 MIL/uL — ABNORMAL HIGH (ref 3.87–5.11)
RDW: 13.1 % (ref 11.5–15.5)
WBC: 8.9 10*3/uL (ref 4.0–10.5)
nRBC: 0 % (ref 0.0–0.2)

## 2017-11-30 LAB — LIPASE, BLOOD: Lipase: 35 U/L (ref 11–51)

## 2017-11-30 LAB — POCT PREGNANCY, URINE: Preg Test, Ur: NEGATIVE

## 2017-11-30 MED ORDER — MORPHINE SULFATE (PF) 4 MG/ML IV SOLN
4.0000 mg | INTRAVENOUS | Status: DC | PRN
  Administered 2017-11-30: 4 mg via INTRAVENOUS
  Filled 2017-11-30: qty 1

## 2017-11-30 MED ORDER — PROMETHAZINE HCL 12.5 MG PO TABS: 12.5000 mg | ORAL_TABLET | Freq: Four times a day (QID) | ORAL | 0 refills | Status: DC | PRN

## 2017-11-30 MED ORDER — PROMETHAZINE HCL 25 MG/ML IJ SOLN
12.5000 mg | Freq: Four times a day (QID) | INTRAMUSCULAR | Status: DC | PRN
  Administered 2017-11-30: 12.5 mg via INTRAVENOUS
  Filled 2017-11-30: qty 1

## 2017-11-30 MED ORDER — SODIUM CHLORIDE 0.9 % IV BOLUS
1000.0000 mL | Freq: Once | INTRAVENOUS | Status: AC
  Administered 2017-11-30: 1000 mL via INTRAVENOUS

## 2017-11-30 MED ORDER — IOPAMIDOL (ISOVUE-300) INJECTION 61%
100.0000 mL | Freq: Once | INTRAVENOUS | Status: AC | PRN
  Administered 2017-11-30: 100 mL via INTRAVENOUS
  Filled 2017-11-30: qty 100

## 2017-11-30 NOTE — ED Triage Notes (Signed)
Pt arrived with complaints of left sided abdominal pain. PT reports the pain feels like a sharp stabbing pain that radiates "up and down" her left side of her abdomen. Pt denies any n/v/d. Pt reports pain started this morning. Pt states she is very anxious

## 2017-11-30 NOTE — ED Notes (Signed)
Lab called for blood collection.

## 2017-11-30 NOTE — ED Provider Notes (Signed)
[facility] Emergency Department Provider Note    First MD Initiated Contact with Patient 11/30/17 1519     (approximate)  I have reviewed the triage vital signs and the nursing notes.   HISTORY  Chief Complaint Abdominal Pain     HPI Alison Mathews is a 32 y.o. female history of gallstones as well as anxiety presents the ER with left-sided and epigastric abdominal pain.  Says the pain started earlier this morning.  States she never had pain like this before.  Patient arrives to ER tachycardic.  States the pain is mild to moderate.  Has not tried anything for the pain.  Denies any chance of being pregnant.  Denies any dysuria.    Past Medical History:  Diagnosis Date  . Anxiety   . Gall stones    No family history on file. History reviewed. No pertinent surgical history. There are no active problems to display for this patient.     Prior to Admission medications   Medication Sig Start Date End Date Taking? Authorizing Provider  clonazePAM (KLONOPIN) 0.5 MG tablet Take 0.5 mg by mouth 2 (two) times daily as needed for anxiety.    [provider]  dicyclomine (BENTYL) 10 MG capsule Take 1 capsule (10 mg total) by mouth 3 (three) times daily as needed for spasms. 08/08/16 08/22/16  Willy Eddy, MD  etodolac (LODINE) 200 MG capsule Take 1 capsule (200 mg total) by mouth every 8 (eight) hours. 07/09/17   Rebecka Apley, MD  famotidine (PEPCID) 40 MG tablet Take 1 tablet (40 mg total) by mouth every evening. 09/13/17 09/13/18  Rebecka Apley, MD  FLUoxetine (PROZAC) 20 MG capsule Take 20 mg by mouth daily.    [provider]  HYDROcodone-acetaminophen (NORCO/VICODIN) 5-325 MG tablet Take 1 tablet by mouth every 4 (four) hours as needed. 03/29/17   Minna Antis, MD  metoCLOPramide (REGLAN) 10 MG tablet Take 1 tablet (10 mg total) by mouth every 6 (six) hours as needed for nausea or vomiting. Patient not taking: Reported on  11/12/2014 09/13/14   Myrna Blazer, MD  ondansetron (ZOFRAN ODT) 4 MG disintegrating tablet Take 1 tablet (4 mg total) by mouth every 8 (eight) hours as needed for nausea or vomiting. 03/29/17   Minna Antis, MD  promethazine (PHENERGAN) 12.5 MG tablet Take 1 tablet (12.5 mg total) by mouth every 6 (six) hours as needed for nausea or vomiting. 11/30/17   Willy Eddy, MD  ranitidine (ZANTAC) 150 MG capsule Take 1 capsule (150 mg total) by mouth 2 (two) times daily. Patient not taking: Reported on 08/08/2016 11/10/14   Sharman Cheek, MD  sucralfate (CARAFATE) 1 G tablet Take 1 tablet (1 g total) by mouth 4 (four) times daily. Patient not taking: Reported on 08/08/2016 11/10/14   Sharman Cheek, MD  sucralfate (CARAFATE) 1 g tablet Take 1 tablet (1 g total) by mouth 2 (two) times daily. 09/13/17   Rebecka Apley, MD  traMADol (ULTRAM) 50 MG tablet Take 1 tablet (50 mg total) by mouth every 6 (six) hours as needed. 07/09/17   Rebecka Apley, MD    Allergies Motrin [ibuprofen]    Social History Social History   Tobacco Use  . Smoking status: Never Smoker  . Smokeless tobacco: Never Used  Substance Use Topics  . Alcohol use: No  . Drug use: No    Review of Systems Patient denies headaches, rhinorrhea, blurry vision, numbness, shortness of breath, chest pain, edema,  cough, abdominal pain, nausea, vomiting, diarrhea, dysuria, fevers, rashes or hallucinations unless otherwise stated above in HPI. ____________________________________________   PHYSICAL EXAM:  VITAL SIGNS: Vitals:   11/30/17 1429 11/30/17 1433  BP: (!) 143/86   Pulse: (!) 121 (!) 114  Temp: 98.3 F (36.8 C)   SpO2: 100%     Constitutional: Alert and oriented.  Eyes: Conjunctivae are normal.  Head: Atraumatic. Nose: No congestion/rhinnorhea. Mouth/Throat: Mucous membranes are moist.   Neck: No stridor. Painless ROM.  Cardiovascular: Normal rate, regular rhythm. Grossly normal heart  sounds.  Good peripheral circulation. Respiratory: Normal respiratory effort.  No retractions. Lungs CTAB. Gastrointestinal: Soft with ttp of LUQr. No distention. No abdominal bruits. No CVA tenderness. Genitourinary:  Musculoskeletal: No lower extremity tenderness nor edema.  No joint effusions. Neurologic:  Normal speech and language. No gross focal neurologic deficits are appreciated. No facial droop Skin:  Skin is warm, dry and intact. No rash noted. Psychiatric: Mood and affect are normal. Speech and behavior are normal.  ____________________________________________   LABS (all labs ordered are listed, but only abnormal results are displayed)  Results for orders placed or performed during the hospital encounter of 11/30/17 (from the past 24 hour(s))  Urinalysis, Complete w Microscopic     Status: Abnormal   Collection Time: 11/30/17  2:36 PM  Result Value Ref Range   Color, Urine YELLOW (A) YELLOW   APPearance HAZY (A) CLEAR   Specific Gravity, Urine 1.017 1.005 - 1.030   pH 6.0 5.0 - 8.0   Glucose, UA NEGATIVE NEGATIVE mg/dL   Hgb urine dipstick NEGATIVE NEGATIVE   Bilirubin Urine NEGATIVE NEGATIVE   Ketones, ur NEGATIVE NEGATIVE mg/dL   Protein, ur NEGATIVE NEGATIVE mg/dL   Nitrite NEGATIVE NEGATIVE   Leukocytes, UA TRACE (A) NEGATIVE   RBC / HPF 0-5 0 - 5 RBC/hpf   WBC, UA 6-10 0 - 5 WBC/hpf   Bacteria, UA RARE (A) NONE SEEN   Squamous Epithelial / LPF 6-10 0 - 5   Mucus PRESENT   Pregnancy, urine POC     Status: None   Collection Time: 11/30/17  2:43 PM  Result Value Ref Range   Preg Test, Ur NEGATIVE NEGATIVE  Lipase, blood     Status: None   Collection Time: 11/30/17  2:51 PM  Result Value Ref Range   Lipase 35 11 - 51 U/L  Comprehensive metabolic panel     Status: Abnormal   Collection Time: 11/30/17  2:51 PM  Result Value Ref Range   Sodium 140 135 - 145 mmol/L   Potassium 4.6 3.5 - 5.1 mmol/L   Chloride 104 98 - 111 mmol/L   CO2 30 22 - 32 mmol/L    Glucose, Bld 94 70 - 99 mg/dL   BUN 9 6 - 20 mg/dL   Creatinine, Ser 4.01 0.44 - 1.00 mg/dL   Calcium 9.7 8.9 - 02.7 mg/dL   Total Protein 8.3 (H) 6.5 - 8.1 g/dL   Albumin 4.5 3.5 - 5.0 g/dL   AST 27 15 - 41 U/L   ALT 29 0 - 44 U/L   Alkaline Phosphatase 49 38 - 126 U/L   Total Bilirubin 0.2 (L) 0.3 - 1.2 mg/dL   GFR calc non Af Amer >60 >60 mL/min   GFR calc Af Amer >60 >60 mL/min   Anion gap 6 5 - 15  CBC     Status: Abnormal   Collection Time: 11/30/17  2:51 PM  Result Value Ref Range  WBC 8.9 4.0 - 10.5 K/uL   RBC 5.38 (H) 3.87 - 5.11 MIL/uL   Hemoglobin 15.2 (H) 12.0 - 15.0 g/dL   HCT 16.1 (H) 09.6 - 04.5 %   MCV 87.9 80.0 - 100.0 fL   MCH 28.3 26.0 - 34.0 pg   MCHC 32.1 30.0 - 36.0 g/dL   RDW 40.9 81.1 - 91.4 %   Platelets 378 150 - 400 K/uL   nRBC 0.0 0.0 - 0.2 %   ____________________________________________ ____________________________________________  RADIOLOGY  I personally reviewed all radiographic images ordered to evaluate for the above acute complaints and reviewed radiology reports and findings.  These findings were personally discussed with the patient.  Please see medical record for radiology report.  ____________________________________________   PROCEDURES  Procedure(s) performed:  Procedures    Critical Care performed: no ____________________________________________   INITIAL IMPRESSION / ASSESSMENT AND PLAN / ED COURSE  Pertinent labs & imaging results that were available during my care of the patient were reviewed by me and considered in my medical decision making (see chart for details).   DDX: Enteritis, colitis, IBD, diverticulitis, perforation, abscess, SBO, cholelithiasis, cholecystitis, UTI  KEILIN GAMBOA is a 32 y.o. who presents to the ED with symptoms as described above.  Patient tachycardic and has tenderness palpation as described above.  Blood work is fairly reassuring but does not show dehydration and concentrated CBC.  We  will give IV fluids as well as IV pain medication.  CT imaging will be ordered to evaluate for intra-abdominal pathology.  Clinical Course as of Nov 30 1745  Wed Nov 30, 2017  1718 CT imaging is benign.  Blood work reassuring.  At this point do believe she stable and appropriate for outpatient follow-up.  Patient reassessed.  Heart rate improved with IV fluids.  Repeat abdominal exam soft and benign   [PR]    Clinical Course User Index [PR] Willy Eddy, MD     As part of my medical decision making, I reviewed the following data within the electronic MEDICAL RECORD NUMBER Nursing notes reviewed and incorporated, Labs reviewed, notes from prior ED visits and [place] Controlled Substance Database   ____________________________________________   FINAL CLINICAL IMPRESSION(S) / ED DIAGNOSES  Final diagnoses:  Left upper quadrant pain      NEW MEDICATIONS STARTED DURING THIS VISIT:  Current Discharge Medication List       Note:  This document was prepared using Dragon voice recognition software and may include unintentional dictation errors.    Willy Eddy, MD 11/30/17 [phone]

## 2017-11-30 NOTE — ED Notes (Signed)
AAOx3.  Skin warm and dry.  NAD 

## 2017-11-30 NOTE — Discharge Instructions (Signed)

## 2017-11-30 NOTE — ED Triage Notes (Signed)
First Nurse Note:  C/O LUQ abdominal pain, onset of symptoms this am. Denies N/V/D.  AAOx3.  Skin warm and dry. NAD

## 2017-12-07 ENCOUNTER — Encounter: Payer: Self-pay | Admitting: *Deleted

## 2017-12-07 ENCOUNTER — Other Ambulatory Visit: Payer: Self-pay

## 2017-12-07 DIAGNOSIS — Z79899 Other long term (current) drug therapy: Secondary | ICD-10-CM | POA: Insufficient documentation

## 2017-12-07 DIAGNOSIS — R1012 Left upper quadrant pain: Secondary | ICD-10-CM | POA: Insufficient documentation

## 2017-12-07 DIAGNOSIS — K5900 Constipation, unspecified: Secondary | ICD-10-CM | POA: Insufficient documentation

## 2017-12-07 LAB — COMPREHENSIVE METABOLIC PANEL
ALBUMIN: 4.6 g/dL (ref 3.5–5.0)
ALT: 51 U/L — AB (ref 0–44)
AST: 32 U/L (ref 15–41)
Alkaline Phosphatase: 52 U/L (ref 38–126)
Anion gap: 6 (ref 5–15)
BUN: 9 mg/dL (ref 6–20)
CHLORIDE: 108 mmol/L (ref 98–111)
CO2: 29 mmol/L (ref 22–32)
CREATININE: 0.68 mg/dL (ref 0.44–1.00)
Calcium: 10 mg/dL (ref 8.9–10.3)
GFR calc Af Amer: >60 mL/min (ref >60)
GFR calc non Af Amer: >60 mL/min (ref >60)
GLUCOSE: 99 mg/dL (ref 70–99)
POTASSIUM: 4.3 mmol/L (ref 3.5–5.1)
SODIUM: 143 mmol/L (ref 135–145)
Total Bilirubin: 0.3 mg/dL (ref 0.3–1.2)
Total Protein: 8.4 g/dL — ABNORMAL HIGH (ref 6.5–8.1)

## 2017-12-07 LAB — URINALYSIS, COMPLETE (UACMP) WITH MICROSCOPIC
BILIRUBIN URINE: NEGATIVE
Glucose, UA: NEGATIVE mg/dL
HGB URINE DIPSTICK: NEGATIVE
Ketones, ur: NEGATIVE mg/dL
Leukocytes, UA: NEGATIVE
Nitrite: NEGATIVE
PROTEIN: NEGATIVE mg/dL
SPECIFIC GRAVITY, URINE: 1.013 (ref 1.005–1.030)
pH: 7 (ref 5.0–8.0)

## 2017-12-07 LAB — LIPASE, BLOOD: LIPASE: 39 U/L (ref 11–51)

## 2017-12-07 LAB — CBC
HEMATOCRIT: 47.4 % — AB (ref 36.0–46.0)
HEMOGLOBIN: 15.2 g/dL — AB (ref 12.0–15.0)
MCH: 27.7 pg (ref 26.0–34.0)
MCHC: 32.1 g/dL (ref 30.0–36.0)
MCV: 86.5 fL (ref 80.0–100.0)
Platelets: 411 10*3/uL — ABNORMAL HIGH (ref 150–400)
RBC: 5.48 MIL/uL — ABNORMAL HIGH (ref 3.87–5.11)
RDW: 13.2 % (ref 11.5–15.5)
WBC: 9.6 10*3/uL (ref 4.0–10.5)
nRBC: 0 % (ref 0.0–0.2)

## 2017-12-07 LAB — POCT PREGNANCY, URINE: PREG TEST UR: NEGATIVE

## 2017-12-07 NOTE — ED Triage Notes (Signed)
Pt reports midline to lower left sided abdominal pain for 1 day, associated with nausea. No urinary or vaginal symptoms.

## 2017-12-08 ENCOUNTER — Emergency Department
Admission: EM | Admit: 2017-12-08 | Discharge: 2017-12-08 | Disposition: A | Discharge status: Home / Self Care | Payer: Self-pay | Attending: Emergency Medicine | Admitting: Emergency Medicine

## 2017-12-08 ENCOUNTER — Emergency Department: Payer: Self-pay

## 2017-12-08 DIAGNOSIS — R1012 Left upper quadrant pain: Secondary | ICD-10-CM

## 2017-12-08 DIAGNOSIS — K5900 Constipation, unspecified: Secondary | ICD-10-CM

## 2017-12-08 LAB — MONONUCLEOSIS SCREEN: Mono Screen: NEGATIVE

## 2017-12-08 MED ORDER — LACTULOSE 10 GM/15ML PO SOLN: 20.0000 g | Freq: Every day | ORAL | 0 refills | Status: DC | PRN

## 2017-12-08 MED ORDER — FAMOTIDINE IN NACL 20-0.9 MG/50ML-% IV SOLN
20.0000 mg | Freq: Once | INTRAVENOUS | Status: AC
  Administered 2017-12-08: 20 mg via INTRAVENOUS
  Filled 2017-12-08: qty 50

## 2017-12-08 MED ORDER — DICYCLOMINE HCL 20 MG PO TABS: 20.0000 mg | ORAL_TABLET | Freq: Four times a day (QID) | ORAL | 0 refills | Status: DC | PRN

## 2017-12-08 MED ORDER — SODIUM CHLORIDE 0.9 % IV BOLUS
1000.0000 mL | Freq: Once | INTRAVENOUS | Status: AC
  Administered 2017-12-08: 1000 mL via INTRAVENOUS

## 2017-12-08 MED ORDER — DICYCLOMINE HCL 10 MG PO CAPS
20.0000 mg | ORAL_CAPSULE | Freq: Once | ORAL | Status: AC
  Administered 2017-12-08: 20 mg via ORAL
  Filled 2017-12-08: qty 2

## 2017-12-08 NOTE — ED Notes (Addendum)
Pt to ultrasound and xray

## 2017-12-08 NOTE — ED Notes (Signed)
Pt returned from ultrasound

## 2017-12-08 NOTE — ED Provider Notes (Signed)
[facility] Emergency Department Provider Note   ____________________________________________   First MD Initiated Contact with Patient 12/08/17 0118     (approximate)  I have reviewed the triage vital signs and the nursing notes.   HISTORY  Chief Complaint Abdominal Pain    HPI Alison Mathews is a 32 y.o. female who returns to the ED from home with a chief complaint of left-sided abdominal pain.  Patient was seen in the ED for same on 10/9.  Had a negative CT scan and discharged home.  Reports return of left upper abdominal pain for the past day.  Denies exacerbation when eating.  Denies sick contacts.  Denies associated fever, chills, chest pain, shortness of breath, nausea, vomiting, vaginal discharge, dysuria, diarrhea or constipation.  Denies recent travel or trauma.   Past Medical History:  Diagnosis Date  . Anxiety   . Gall stones     There are no active problems to display for this patient.   History reviewed. No pertinent surgical history.  Prior to Admission medications   Medication Sig Start Date End Date Taking? Authorizing Provider  clonazePAM (KLONOPIN) 0.5 MG tablet Take 0.5 mg by mouth 2 (two) times daily as needed for anxiety.    [provider]  dicyclomine (BENTYL) 20 MG tablet Take 1 tablet (20 mg total) by mouth every 6 (six) hours as needed. 12/08/17   Irean Hong, MD  etodolac (LODINE) 200 MG capsule Take 1 capsule (200 mg total) by mouth every 8 (eight) hours. 07/09/17   Rebecka Apley, MD  famotidine (PEPCID) 40 MG tablet Take 1 tablet (40 mg total) by mouth every evening. 09/13/17 09/13/18  Rebecka Apley, MD  FLUoxetine (PROZAC) 20 MG capsule Take 20 mg by mouth daily.    [provider]  HYDROcodone-acetaminophen (NORCO/VICODIN) 5-325 MG tablet Take 1 tablet by mouth every 4 (four) hours as needed. 03/29/17   Minna Antis, MD  lactulose (CHRONULAC) 10 GM/15ML solution Take 30 mLs (20 g total) by  mouth daily as needed for mild constipation. 12/08/17   Irean Hong, MD  metoCLOPramide (REGLAN) 10 MG tablet Take 1 tablet (10 mg total) by mouth every 6 (six) hours as needed for nausea or vomiting. Patient not taking: Reported on 11/12/2014 09/13/14   Myrna Blazer, MD  ondansetron (ZOFRAN ODT) 4 MG disintegrating tablet Take 1 tablet (4 mg total) by mouth every 8 (eight) hours as needed for nausea or vomiting. 03/29/17   Minna Antis, MD  promethazine (PHENERGAN) 12.5 MG tablet Take 1 tablet (12.5 mg total) by mouth every 6 (six) hours as needed for nausea or vomiting. 11/30/17   Willy Eddy, MD  ranitidine (ZANTAC) 150 MG capsule Take 1 capsule (150 mg total) by mouth 2 (two) times daily. Patient not taking: Reported on 08/08/2016 11/10/14   Sharman Cheek, MD  sucralfate (CARAFATE) 1 G tablet Take 1 tablet (1 g total) by mouth 4 (four) times daily. Patient not taking: Reported on 08/08/2016 11/10/14   Sharman Cheek, MD  sucralfate (CARAFATE) 1 g tablet Take 1 tablet (1 g total) by mouth 2 (two) times daily. 09/13/17   Rebecka Apley, MD  traMADol (ULTRAM) 50 MG tablet Take 1 tablet (50 mg total) by mouth every 6 (six) hours as needed. 07/09/17   Rebecka Apley, MD    Allergies Motrin [ibuprofen]  No family history on file.  Social History Social History   Tobacco Use  . Smoking status: Never Smoker  .  Smokeless tobacco: Never Used  Substance Use Topics  . Alcohol use: No  . Drug use: No    Review of Systems  Constitutional: No fever/chills Eyes: No visual changes. ENT: No sore throat. Cardiovascular: Denies chest pain. Respiratory: Denies shortness of breath. Gastrointestinal: Positive for abdominal pain.  No nausea, no vomiting.  No diarrhea.  No constipation. Genitourinary: Negative for dysuria. Musculoskeletal: Negative for back pain. Skin: Negative for rash. Neurological: Negative for headaches, focal weakness or  numbness.   ____________________________________________   PHYSICAL EXAM:  VITAL SIGNS: ED Triage Vitals [12/07/17 2203]  Enc Vitals Group     BP (!) 131/94     Pulse Rate (!) 108     Resp 18     Temp 98.4 F (36.9 C)     Temp Source Oral     SpO2 99 %     Weight      Height      Head Circumference      Peak Flow      Pain Score 7     Pain Loc      Pain Edu?      Excl. in GC?     Constitutional: Alert and oriented. Well appearing and in no acute distress. Eyes: Conjunctivae are normal. PERRL. EOMI. Head: Atraumatic. Nose: No congestion/rhinnorhea. Mouth/Throat: Mucous membranes are moist.  Oropharynx non-erythematous. Neck: No stridor.   Cardiovascular: Normal rate, regular rhythm. Grossly normal heart sounds.  Good peripheral circulation. Respiratory: Normal respiratory effort.  No retractions. Lungs CTAB.  No splinting.  No crepitus.  Left lower anterior ribs point tender to palpation. Gastrointestinal: Soft and mildly tender to palpation left upper quadrant without rebound or guarding. No distention. No abdominal bruits. No CVA tenderness. Musculoskeletal: No lower extremity tenderness nor edema.  No joint effusions. Neurologic:  Normal speech and language. No gross focal neurologic deficits are appreciated. No gait instability. Skin:  Skin is warm, dry and intact. No rash noted. Psychiatric: Mood and affect are normal. Speech and behavior are normal.  ____________________________________________   LABS (all labs ordered are listed, but only abnormal results are displayed)  Labs Reviewed  COMPREHENSIVE METABOLIC PANEL - Abnormal; Notable for the following components:      Result Value   Total Protein 8.4 (*)    ALT 51 (*)    All other components within normal limits  CBC - Abnormal; Notable for the following components:   RBC 5.48 (*)    Hemoglobin 15.2 (*)    HCT 47.4 (*)    Platelets 411 (*)    All other components within normal limits  URINALYSIS,  COMPLETE (UACMP) WITH MICROSCOPIC - Abnormal; Notable for the following components:   Color, Urine YELLOW (*)    APPearance CLEAR (*)    Bacteria, UA RARE (*)    All other components within normal limits  LIPASE, BLOOD  MONONUCLEOSIS SCREEN  POC URINE PREG, ED  POCT PREGNANCY, URINE   ____________________________________________  EKG  None ____________________________________________  RADIOLOGY  ED MD interpretation: Normal ultrasound; moderate stool burden  Official radiology report(s): US Abdomen Complete  Result Date: 12/08/2017 CLINICAL DATA:  32 y/o F; left upper quadrant and epigastric abdominal pain with nausea for 1 day. EXAM: ABDOMEN ULTRASOUND COMPLETE COMPARISON:  11/30/2017 CT abdomen and pelvis. 03/30/2015 abdominal ultrasound. FINDINGS: Gallbladder: Contracted gallbladder. No gallstones or wall thickening visualized. No sonographic Murphy sign noted by sonographer. Common bile duct: Diameter: 3.0 mm Liver: No focal lesion identified. Within normal limits in parenchymal echogenicity. Portal  vein is patent on color Doppler imaging with normal direction of blood flow towards the liver. IVC: No abnormality visualized. Pancreas: Visualized portion unremarkable. Largely obscured by bowel gas shadow. Spleen: Size and appearance within normal limits. Right Kidney: Length: 10.7 cm. Echogenicity within normal limits. No mass or hydronephrosis visualized. Left Kidney: Length: 10.6 cm. Echogenicity within normal limits. No mass or hydronephrosis visualized. Abdominal aorta: No aneurysm visualized. Other findings: None. IMPRESSION: No acute process identified.  Unremarkable abdominal ultrasound. Electronically Signed   By: Mitzi Hansen M.D.   On: 12/08/2017 02:55   Dg Abd Acute W/chest  Result Date: 12/08/2017 CLINICAL DATA:  Left upper quadrant pain EXAM: DG ABDOMEN ACUTE W/ 1V CHEST COMPARISON:  CT 11/30/2017 FINDINGS: There is no evidence of dilated bowel loops or free  intraperitoneal air. No radiopaque calculi or other significant radiographic abnormality is seen. Heart size and mediastinal contours are within normal limits. Both lungs are clear. Moderate stool in the colon. Small phleboliths in the pelvis. IMPRESSION: Negative abdominal radiographs.  No acute cardiopulmonary disease. Electronically Signed   By: Jasmine Pang M.D.   On: 12/08/2017 01:53    ____________________________________________   PROCEDURES  Procedure(s) performed: None  Procedures  Critical Care performed: No  ____________________________________________   INITIAL IMPRESSION / ASSESSMENT AND PLAN / ED COURSE  As part of my medical decision making, I reviewed the following data within the electronic MEDICAL RECORD NUMBER Nursing notes reviewed and incorporated, Labs reviewed, Old chart reviewed, Radiograph reviewed  and Notes from prior ED visits   32 year old female who presents with left upper quadrant abdominal pain. Differential diagnosis includes, but is not limited to, biliary disease (biliary colic, acute cholecystitis, cholangitis, choledocholithiasis, etc), intrathoracic causes for epigastric abdominal pain including ACS, gastritis, duodenitis, pancreatitis, small bowel or large bowel obstruction, abdominal aortic aneurysm, hernia, and ulcer(s).  Laboratory urinalysis results unremarkable other than hemoconcentration which is stable from prior visit.  I personally reviewed patient's prior ED visit including the results of her CT scan which demonstrated diverticulosis without evidence of diverticulitis.  Given location of her pain, will check Monospot.  Obtain three-way abdomen to view both left rib and abdomen.  Ultrasound of spleen and gallbladder.  Will administer 20 mg IV Pepcid for discomfort and initiate IV fluid resuscitation.  Will reassess.  Clinical Course as of Dec 08 620  Thu Dec 08, 2017  [id number] Updated patient on all test results.  Awaiting Monospot.  Patient  given Bentyl for discomfort.   [JS]  [id number] Updated patient on negative Monospot.  Will discharge home on Lactulose and Bentyl to use as needed.  She will follow-up closely with her PCP.  Strict return precautions given.  Patient verbalizes understanding and agrees with plan of care.   [JS]    Clinical Course User Index [JS] Irean Hong, MD     ____________________________________________   FINAL CLINICAL IMPRESSION(S) / ED DIAGNOSES  Final diagnoses:  Left upper quadrant pain  Constipation, unspecified constipation type     ED Discharge Orders         Ordered    dicyclomine (BENTYL) 20 MG tablet  Every 6 hours PRN     12/08/17 0457    lactulose (CHRONULAC) 10 GM/15ML solution  Daily PRN     12/08/17 0457           Note:  This document was prepared using Dragon voice recognition software and may include unintentional dictation errors.    Irean Hong, MD 12/08/17 [phone]

## 2017-12-08 NOTE — ED Notes (Signed)
Pt resting quietly on stretcher. No distress noted. C/o continued abd. Pain. Med order received.

## 2017-12-08 NOTE — Discharge Instructions (Signed)
1.  You may take Bentyl as needed for abdominal discomfort. 2.  Take Lactulose as needed for bowel movements. 3.  For best daily bowel routine, take the following daily (these are over-the-counter): Miralax  Stool softener (Colace, Dulcolax, Senna) 4.  Drink plenty of fluids daily. 5.  Return to the ER for worsening symptoms, persistent vomiting, difficulty breathing or other concerns.

## 2017-12-08 NOTE — ED Notes (Signed)
Pt resting with eyes closed and even respirations. Lights off to enhance rest. No distress noted.

## 2017-12-08 NOTE — ED Notes (Addendum)
Pt iv fluids complete. Resting on stretcher with lights out to enhance rest. No distress noted at this time. Provided for comfort and safety and will continue to assess.

## 2017-12-08 NOTE — ED Notes (Signed)
No answer when called several times from lobby 

## 2018-01-01 ENCOUNTER — Emergency Department
Admission: EM | Admit: 2018-01-01 | Discharge: 2018-01-01 | Disposition: A | Discharge status: Home / Self Care | Payer: Self-pay | Attending: Emergency Medicine | Admitting: Emergency Medicine

## 2018-01-01 ENCOUNTER — Encounter: Payer: Self-pay | Admitting: Emergency Medicine

## 2018-01-01 ENCOUNTER — Other Ambulatory Visit: Payer: Self-pay

## 2018-01-01 DIAGNOSIS — Z3202 Encounter for pregnancy test, result negative: Secondary | ICD-10-CM | POA: Insufficient documentation

## 2018-01-01 DIAGNOSIS — R1031 Right lower quadrant pain: Secondary | ICD-10-CM | POA: Insufficient documentation

## 2018-01-01 DIAGNOSIS — Z79899 Other long term (current) drug therapy: Secondary | ICD-10-CM | POA: Insufficient documentation

## 2018-01-01 LAB — COMPREHENSIVE METABOLIC PANEL
ALBUMIN: 4.1 g/dL (ref 3.5–5.0)
ALT: 27 U/L (ref 0–44)
ANION GAP: 8 (ref 5–15)
AST: 23 U/L (ref 15–41)
Alkaline Phosphatase: 44 U/L (ref 38–126)
BUN: 9 mg/dL (ref 6–20)
CO2: 29 mmol/L (ref 22–32)
Calcium: 9.4 mg/dL (ref 8.9–10.3)
Chloride: 105 mmol/L (ref 98–111)
Creatinine, Ser: 0.64 mg/dL (ref 0.44–1.00)
GLUCOSE: 106 mg/dL — AB (ref 70–99)
POTASSIUM: 3.7 mmol/L (ref 3.5–5.1)
Sodium: 142 mmol/L (ref 135–145)
TOTAL PROTEIN: 7.7 g/dL (ref 6.5–8.1)
Total Bilirubin: 0.4 mg/dL (ref 0.3–1.2)

## 2018-01-01 LAB — CBC
HCT: 42.8 % (ref 36.0–46.0)
HEMOGLOBIN: 13.9 g/dL (ref 12.0–15.0)
MCH: 27.7 pg (ref 26.0–34.0)
MCHC: 32.5 g/dL (ref 30.0–36.0)
MCV: 85.4 fL (ref 80.0–100.0)
NRBC: 0 % (ref 0.0–0.2)
PLATELETS: 380 10*3/uL (ref 150–400)
RBC: 5.01 MIL/uL (ref 3.87–5.11)
RDW: 12.8 % (ref 11.5–15.5)
WBC: 8.5 10*3/uL (ref 4.0–10.5)

## 2018-01-01 LAB — URINALYSIS, COMPLETE (UACMP) WITH MICROSCOPIC
Bacteria, UA: NONE SEEN
Bilirubin Urine: NEGATIVE
GLUCOSE, UA: NEGATIVE mg/dL
Ketones, ur: NEGATIVE mg/dL
NITRITE: NEGATIVE
PH: 5 (ref 5.0–8.0)
PROTEIN: NEGATIVE mg/dL
Specific Gravity, Urine: 1.017 (ref 1.005–1.030)

## 2018-01-01 LAB — LIPASE, BLOOD: Lipase: 37 U/L (ref 11–51)

## 2018-01-01 LAB — POCT PREGNANCY, URINE: Preg Test, Ur: NEGATIVE

## 2018-01-01 MED ORDER — ONDANSETRON HCL 4 MG/2ML IJ SOLN
4.0000 mg | Freq: Once | INTRAMUSCULAR | Status: AC
  Administered 2018-01-01: 4 mg via INTRAVENOUS
  Filled 2018-01-01: qty 2

## 2018-01-01 MED ORDER — MORPHINE SULFATE (PF) 4 MG/ML IV SOLN
4.0000 mg | Freq: Once | INTRAVENOUS | Status: AC
  Administered 2018-01-01: 4 mg via INTRAVENOUS
  Filled 2018-01-01: qty 1

## 2018-01-01 NOTE — ED Triage Notes (Signed)
Pt to ED via POV c/o right sided abd pain x 45 minutes. Pt denies N/V/D. Pt denies trying anything for pain at home. Pt is in NAD at this time.

## 2018-01-01 NOTE — ED Provider Notes (Signed)
[facility] Emergency Department Provider Note   ____________________________________________    I have reviewed the triage vital signs and the nursing notes.   HISTORY  Chief Complaint Abdominal Pain     HPI Alison Mathews is a 32 y.o. female who presents with complaints of abdominal pain.  Patient describes right lower quadrant abdominal pain.  Started approximately 2 hours prior to arrival, she reports the pain is moderate.  She does not take anything for this.  She denies nausea or vomiting.  No fevers or chills.  No dysuria.  No vaginal discharge.  Review of records demonstrate patient has had multiple episodes of both right and left lower quadrant abdominal pain in the past, multiple CT scans for similar complaints.   Past Medical History:  Diagnosis Date  . Anxiety   . Gall stones     There are no active problems to display for this patient.   History reviewed. No pertinent surgical history.  Prior to Admission medications   Medication Sig Start Date End Date Taking? Authorizing Provider  clonazePAM (KLONOPIN) 0.5 MG tablet Take 0.5 mg by mouth 2 (two) times daily as needed for anxiety.    [provider]  dicyclomine (BENTYL) 20 MG tablet Take 1 tablet (20 mg total) by mouth every 6 (six) hours as needed. 12/08/17   Irean Hong, MD  etodolac (LODINE) 200 MG capsule Take 1 capsule (200 mg total) by mouth every 8 (eight) hours. 07/09/17   Rebecka Apley, MD  famotidine (PEPCID) 40 MG tablet Take 1 tablet (40 mg total) by mouth every evening. 09/13/17 09/13/18  Rebecka Apley, MD  FLUoxetine (PROZAC) 20 MG capsule Take 20 mg by mouth daily.    [provider]  HYDROcodone-acetaminophen (NORCO/VICODIN) 5-325 MG tablet Take 1 tablet by mouth every 4 (four) hours as needed. 03/29/17   Minna Antis, MD  lactulose (CHRONULAC) 10 GM/15ML solution Take 30 mLs (20 g total) by mouth daily as needed for mild constipation.  12/08/17   Irean Hong, MD  metoCLOPramide (REGLAN) 10 MG tablet Take 1 tablet (10 mg total) by mouth every 6 (six) hours as needed for nausea or vomiting. Patient not taking: Reported on 11/12/2014 09/13/14   Myrna Blazer, MD  ondansetron (ZOFRAN ODT) 4 MG disintegrating tablet Take 1 tablet (4 mg total) by mouth every 8 (eight) hours as needed for nausea or vomiting. 03/29/17   Minna Antis, MD  promethazine (PHENERGAN) 12.5 MG tablet Take 1 tablet (12.5 mg total) by mouth every 6 (six) hours as needed for nausea or vomiting. 11/30/17   Willy Eddy, MD  ranitidine (ZANTAC) 150 MG capsule Take 1 capsule (150 mg total) by mouth 2 (two) times daily. Patient not taking: Reported on 08/08/2016 11/10/14   Sharman Cheek, MD  sucralfate (CARAFATE) 1 G tablet Take 1 tablet (1 g total) by mouth 4 (four) times daily. Patient not taking: Reported on 08/08/2016 11/10/14   Sharman Cheek, MD  sucralfate (CARAFATE) 1 g tablet Take 1 tablet (1 g total) by mouth 2 (two) times daily. 09/13/17   Rebecka Apley, MD  traMADol (ULTRAM) 50 MG tablet Take 1 tablet (50 mg total) by mouth every 6 (six) hours as needed. 07/09/17   Rebecka Apley, MD     Allergies Motrin [ibuprofen]  No family history on file.  Social History Social History   Tobacco Use  . Smoking status: Never Smoker  . Smokeless tobacco: Never Used  Substance Use Topics  . Alcohol use: No  . Drug use: No    Review of Systems  Constitutional: No fever/chills Eyes: No visual changes.  ENT: No sore throat. Cardiovascular: Denies chest pain. Respiratory: Denies shortness of breath. Gastrointestinal: As above Genitourinary: Negative for dysuria. Musculoskeletal: Negative for back pain. Skin: Negative for rash. Neurological: Negative for headaches or weakness   ____________________________________________   PHYSICAL EXAM:  VITAL SIGNS: ED Triage Vitals  Enc Vitals Group     BP 01/01/18 1830 132/75       Pulse Rate 01/01/18 1830 (!) 114     Resp 01/01/18 1830 16     Temp 01/01/18 1830 98.7 F (37.1 C)     Temp Source 01/01/18 1830 Oral     SpO2 01/01/18 1830 98 %     Weight --      Height --      Head Circumference --      Peak Flow --      Pain Score 01/01/18 1828 8     Pain Loc --      Pain Edu? --      Excl. in GC? --     Constitutional: Alert and oriented. Eyes: Conjunctivae are normal.   Nose: No congestion/rhinnorhea. Mouth/Throat: Mucous membranes are moist.    Cardiovascular: Normal rate, regular rhythm. Grossly normal heart sounds.  Good peripheral circulation. Respiratory: Normal respiratory effort.  No retractions. Lungs CTAB. Gastrointestinal: Mild right lower quadrant tenderness, no distention  Musculoskeletal:   Warm and well perfused Neurologic:  Normal speech and language. No gross focal neurologic deficits are appreciated.  Skin:  Skin is warm, dry and intact. No rash noted. Psychiatric: Mood and affect are normal. Speech and behavior are normal.  ____________________________________________   LABS (all labs ordered are listed, but only abnormal results are displayed)  Labs Reviewed  COMPREHENSIVE METABOLIC PANEL - Abnormal; Notable for the following components:      Result Value   Glucose, Bld 106 (*)    All other components within normal limits  URINALYSIS, COMPLETE (UACMP) WITH MICROSCOPIC - Abnormal; Notable for the following components:   Color, Urine YELLOW (*)    APPearance CLOUDY (*)    Hgb urine dipstick MODERATE (*)    Leukocytes, UA SMALL (*)    All other components within normal limits  LIPASE, BLOOD  CBC  POC URINE PREG, ED  POCT PREGNANCY, URINE   ____________________________________________  EKG  None ____________________________________________  RADIOLOGY  None ____________________________________________   PROCEDURES  Procedure(s) performed: No  Procedures   Critical Care performed:  No ____________________________________________   INITIAL IMPRESSION / ASSESSMENT AND PLAN / ED COURSE  Pertinent labs & imaging results that were available during my care of the patient were reviewed by me and considered in my medical decision making (see chart for details).  Patient overall well-appearing in no acute distress.  Doubt appendicitis as abdominal exam is overall reassuring.  Multiple repeat visits for abdominal pain is concerning, discussed with her the need to avoid CT scan if possible.  Thus far labs are reassuring. Will give IV morphine and IV zofran and reevaluate.  Patient felt better after treatment.  She feels comfortable avoiding imaging at this time, she will return to the ED if symptoms worsen.      ____________________________________________   FINAL CLINICAL IMPRESSION(S) / ED DIAGNOSES  Final diagnoses:  Right lower quadrant abdominal pain        Note:  This document was prepared using Dragon voice  recognition software and may include unintentional dictation errors.    Jene Every, MD 01/01/18 [phone]

## 2018-03-11 ENCOUNTER — Other Ambulatory Visit: Payer: Self-pay

## 2018-03-11 ENCOUNTER — Emergency Department: Payer: Self-pay

## 2018-03-11 ENCOUNTER — Observation Stay
Admission: EM | Admit: 2018-03-11 | Discharge: 2018-03-13 | Disposition: A | Discharge status: Home / Self Care | Payer: Self-pay | Attending: Internal Medicine | Admitting: Internal Medicine

## 2018-03-11 ENCOUNTER — Encounter: Payer: Self-pay | Admitting: Emergency Medicine

## 2018-03-11 DIAGNOSIS — F1910 Other psychoactive substance abuse, uncomplicated: Secondary | ICD-10-CM | POA: Insufficient documentation

## 2018-03-11 DIAGNOSIS — Z6835 Body mass index (BMI) 35.0-35.9, adult: Secondary | ICD-10-CM | POA: Insufficient documentation

## 2018-03-11 DIAGNOSIS — E669 Obesity, unspecified: Secondary | ICD-10-CM | POA: Insufficient documentation

## 2018-03-11 DIAGNOSIS — R000 Tachycardia, unspecified: Secondary | ICD-10-CM | POA: Diagnosis present

## 2018-03-11 DIAGNOSIS — E119 Type 2 diabetes mellitus without complications: Secondary | ICD-10-CM | POA: Insufficient documentation

## 2018-03-11 DIAGNOSIS — Z79899 Other long term (current) drug therapy: Secondary | ICD-10-CM | POA: Insufficient documentation

## 2018-03-11 DIAGNOSIS — I2510 Atherosclerotic heart disease of native coronary artery without angina pectoris: Secondary | ICD-10-CM | POA: Insufficient documentation

## 2018-03-11 DIAGNOSIS — R059 Cough, unspecified: Secondary | ICD-10-CM

## 2018-03-11 DIAGNOSIS — F419 Anxiety disorder, unspecified: Secondary | ICD-10-CM | POA: Insufficient documentation

## 2018-03-11 DIAGNOSIS — R0602 Shortness of breath: Principal | ICD-10-CM | POA: Insufficient documentation

## 2018-03-11 DIAGNOSIS — E785 Hyperlipidemia, unspecified: Secondary | ICD-10-CM | POA: Insufficient documentation

## 2018-03-11 DIAGNOSIS — R05 Cough: Secondary | ICD-10-CM | POA: Insufficient documentation

## 2018-03-11 DIAGNOSIS — M25561 Pain in right knee: Secondary | ICD-10-CM | POA: Insufficient documentation

## 2018-03-11 LAB — URINALYSIS, COMPLETE (UACMP) WITH MICROSCOPIC
Bacteria, UA: NONE SEEN
Bilirubin Urine: NEGATIVE
Glucose, UA: NEGATIVE mg/dL
Ketones, ur: NEGATIVE mg/dL
Leukocytes, UA: NEGATIVE
Nitrite: NEGATIVE
PROTEIN: NEGATIVE mg/dL
Specific Gravity, Urine: 1.028 (ref 1.005–1.030)
pH: 6 (ref 5.0–8.0)

## 2018-03-11 LAB — POCT PREGNANCY, URINE: Preg Test, Ur: NEGATIVE

## 2018-03-11 LAB — URINE DRUG SCREEN, QUALITATIVE (ARMC ONLY)
Amphetamines, Ur Screen: NOT DETECTED
Barbiturates, Ur Screen: NOT DETECTED
Benzodiazepine, Ur Scrn: NOT DETECTED
Cannabinoid 50 Ng, Ur ~~LOC~~: NOT DETECTED
Cocaine Metabolite,Ur ~~LOC~~: NOT DETECTED
MDMA (ECSTASY) UR SCREEN: NOT DETECTED
Methadone Scn, Ur: NOT DETECTED
Opiate, Ur Screen: POSITIVE — AB
Phencyclidine (PCP) Ur S: NOT DETECTED
Tricyclic, Ur Screen: POSITIVE — AB

## 2018-03-11 LAB — BASIC METABOLIC PANEL
Anion gap: 8 (ref 5–15)
BUN: 11 mg/dL (ref 6–20)
CO2: 28 mmol/L (ref 22–32)
CREATININE: 0.8 mg/dL (ref 0.44–1.00)
Calcium: 9.5 mg/dL (ref 8.9–10.3)
Chloride: 104 mmol/L (ref 98–111)
GFR calc Af Amer: >60 mL/min (ref >60)
GFR calc non Af Amer: >60 mL/min (ref >60)
Glucose, Bld: 81 mg/dL (ref 70–99)
Potassium: 3.7 mmol/L (ref 3.5–5.1)
SODIUM: 140 mmol/L (ref 135–145)

## 2018-03-11 LAB — CBC
HCT: 44.3 % (ref 36.0–46.0)
Hemoglobin: 14.1 g/dL (ref 12.0–15.0)
MCH: 28.2 pg (ref 26.0–34.0)
MCHC: 31.8 g/dL (ref 30.0–36.0)
MCV: 88.6 fL (ref 80.0–100.0)
NRBC: 0 % (ref 0.0–0.2)
PLATELETS: 397 10*3/uL (ref 150–400)
RBC: 5 MIL/uL (ref 3.87–5.11)
RDW: 13.4 % (ref 11.5–15.5)
WBC: 11.5 10*3/uL — ABNORMAL HIGH (ref 4.0–10.5)

## 2018-03-11 LAB — LACTIC ACID, PLASMA: Lactic Acid, Venous: 1.2 mmol/L (ref 0.5–1.9)

## 2018-03-11 LAB — INFLUENZA PANEL BY PCR (TYPE A & B)
INFLAPCR: NEGATIVE
Influenza B By PCR: NEGATIVE

## 2018-03-11 LAB — MAGNESIUM: Magnesium: 2.2 mg/dL (ref 1.7–2.4)

## 2018-03-11 LAB — TROPONIN I: Troponin I: <0.03 ng/mL (ref <0.03)

## 2018-03-11 MED ORDER — CLONAZEPAM 0.5 MG PO TABS
0.5000 mg | ORAL_TABLET | Freq: Two times a day (BID) | ORAL | Status: DC | PRN
  Administered 2018-03-11 – 2018-03-13 (×4): 0.5 mg via ORAL
  Filled 2018-03-11 (×4): qty 1

## 2018-03-11 MED ORDER — ONDANSETRON HCL 4 MG/2ML IJ SOLN
4.0000 mg | Freq: Once | INTRAMUSCULAR | Status: AC
  Administered 2018-03-11: 4 mg via INTRAVENOUS
  Filled 2018-03-11: qty 2

## 2018-03-11 MED ORDER — SODIUM CHLORIDE 0.9 % IV SOLN: 250.0000 mL | INTRAVENOUS | Status: DC | PRN

## 2018-03-11 MED ORDER — ONDANSETRON HCL 4 MG PO TABS: 4.0000 mg | ORAL_TABLET | Freq: Four times a day (QID) | ORAL | Status: DC | PRN

## 2018-03-11 MED ORDER — INFLUENZA VAC SPLIT QUAD 0.5 ML IM SUSY: 0.5000 mL | PREFILLED_SYRINGE | INTRAMUSCULAR | Status: DC

## 2018-03-11 MED ORDER — ACETAMINOPHEN 650 MG RE SUPP: 650.0000 mg | Freq: Four times a day (QID) | RECTAL | Status: DC | PRN

## 2018-03-11 MED ORDER — SODIUM CHLORIDE 0.9% FLUSH: 3.0000 mL | INTRAVENOUS | Status: DC | PRN

## 2018-03-11 MED ORDER — METOPROLOL TARTRATE 25 MG PO TABS
12.5000 mg | ORAL_TABLET | Freq: Two times a day (BID) | ORAL | Status: DC
  Administered 2018-03-11 – 2018-03-13 (×4): 12.5 mg via ORAL
  Filled 2018-03-11 (×4): qty 1

## 2018-03-11 MED ORDER — ACETAMINOPHEN 325 MG PO TABS
650.0000 mg | ORAL_TABLET | Freq: Four times a day (QID) | ORAL | Status: DC | PRN
  Administered 2018-03-11 – 2018-03-12 (×2): 650 mg via ORAL
  Filled 2018-03-11 (×2): qty 2

## 2018-03-11 MED ORDER — ENOXAPARIN SODIUM 40 MG/0.4ML ~~LOC~~ SOLN
40.0000 mg | SUBCUTANEOUS | Status: DC
  Administered 2018-03-11 – 2018-03-12 (×2): 40 mg via SUBCUTANEOUS
  Filled 2018-03-11 (×2): qty 0.4

## 2018-03-11 MED ORDER — ONDANSETRON HCL 4 MG/2ML IJ SOLN
4.0000 mg | Freq: Four times a day (QID) | INTRAMUSCULAR | Status: DC | PRN
  Administered 2018-03-12 (×2): 4 mg via INTRAVENOUS
  Filled 2018-03-11 (×2): qty 2

## 2018-03-11 MED ORDER — IOHEXOL 350 MG/ML SOLN
75.0000 mL | Freq: Once | INTRAVENOUS | Status: AC | PRN
  Administered 2018-03-11: 75 mL via INTRAVENOUS

## 2018-03-11 MED ORDER — IPRATROPIUM-ALBUTEROL 0.5-2.5 (3) MG/3ML IN SOLN
3.0000 mL | Freq: Once | RESPIRATORY_TRACT | Status: AC
  Administered 2018-03-11: 3 mL via RESPIRATORY_TRACT
  Filled 2018-03-11: qty 3

## 2018-03-11 MED ORDER — SODIUM CHLORIDE 0.9 % IV SOLN
1000.0000 mL | Freq: Once | INTRAVENOUS | Status: AC
  Administered 2018-03-11: 1000 mL via INTRAVENOUS

## 2018-03-11 MED ORDER — GABAPENTIN 100 MG PO CAPS
100.0000 mg | ORAL_CAPSULE | Freq: Three times a day (TID) | ORAL | Status: DC
  Administered 2018-03-11 – 2018-03-13 (×5): 100 mg via ORAL
  Filled 2018-03-11 (×5): qty 1

## 2018-03-11 MED ORDER — MORPHINE SULFATE (PF) 4 MG/ML IV SOLN
4.0000 mg | Freq: Once | INTRAVENOUS | Status: AC
  Administered 2018-03-11: 4 mg via INTRAVENOUS
  Filled 2018-03-11: qty 1

## 2018-03-11 MED ORDER — SODIUM CHLORIDE 0.9% FLUSH
3.0000 mL | Freq: Two times a day (BID) | INTRAVENOUS | Status: DC
  Administered 2018-03-11 – 2018-03-13 (×4): 3 mL via INTRAVENOUS

## 2018-03-11 MED ORDER — FLUOXETINE HCL 20 MG PO CAPS
20.0000 mg | ORAL_CAPSULE | Freq: Every day | ORAL | Status: DC
  Filled 2018-03-11 (×3): qty 1

## 2018-03-11 MED ORDER — ALBUTEROL SULFATE (2.5 MG/3ML) 0.083% IN NEBU: 2.5000 mg | INHALATION_SOLUTION | RESPIRATORY_TRACT | Status: DC | PRN

## 2018-03-11 MED ORDER — KETOROLAC TROMETHAMINE 30 MG/ML IJ SOLN
30.0000 mg | Freq: Four times a day (QID) | INTRAMUSCULAR | Status: DC | PRN
  Administered 2018-03-11 – 2018-03-13 (×5): 30 mg via INTRAVENOUS
  Filled 2018-03-11 (×5): qty 1

## 2018-03-11 NOTE — ED Notes (Signed)
Patient transported to CT 

## 2018-03-11 NOTE — ED Notes (Signed)
Pt given drink per admitting MD to encourage urination.

## 2018-03-11 NOTE — H&P (Signed)
[employer] [facility] [place] [facility]   PATIENT NAME: Alison Mathews    MR#:  [id number]  DATE OF BIRTH:  10/22/1985  DATE OF ADMISSION:  03/11/2018  PRIMARY CARE PHYSICIAN: Nell Range, Ruben Gottron, MD   REQUESTING/REFERRING PHYSICIAN: Dr. Cyril Loosen.  CHIEF COMPLAINT:   Chief Complaint  Patient presents with  . Shortness of Breath   Shortness of breath since last night and worsening this morning. HISTORY OF PRESENT ILLNESS:  Alison Mathews  is a 32 y.o. female with a known history of anxiety and gallstone.  The patient presented the ED with above chief complaints.  She has shortness of breath and mild cough but denies any fever or chills, no nausea, vomiting or diarrhea.  No sick contact recently.  The patient denies any anxiety.  She was found tachycardia at 130-1 40s.  Influenza tests are negative.  Chest x-ray is unremarkable.  CT angiogram did not report any PE.  But patient still has tachycardia.  Dr. Cyril Loosen requests admission.  PAST MEDICAL HISTORY:   Past Medical History:  Diagnosis Date  . Anxiety   . Gall stones     PAST SURGICAL HISTORY:  No past surgical history on file.  SOCIAL HISTORY:   Social History   Tobacco Use  . Smoking status: Never Smoker  . Smokeless tobacco: Never Used  Substance Use Topics  . Alcohol use: No    FAMILY HISTORY:   Family History  Problem Relation Age of Onset  . Cervical cancer Mother   . Hypertension Mother     DRUG ALLERGIES:   Allergies  Allergen Reactions  . Motrin [Ibuprofen] Nausea And Vomiting    REVIEW OF SYSTEMS:   Review of Systems  Constitutional: Negative for chills, fever and malaise/fatigue.  HENT: Negative for sore throat.   Eyes: Negative for blurred vision and double vision.  Respiratory: Positive for cough and shortness of breath. Negative for hemoptysis, sputum production, wheezing and stridor.   Cardiovascular: Negative for chest pain, palpitations, orthopnea and leg swelling.    Gastrointestinal: Negative for abdominal pain, blood in stool, diarrhea, melena, nausea and vomiting.  Genitourinary: Negative for dysuria, flank pain and hematuria.  Musculoskeletal: Negative for back pain and joint pain.  Skin: Negative for rash.  Neurological: Positive for dizziness and headaches. Negative for sensory change, focal weakness, seizures, loss of consciousness and weakness.  Endo/Heme/Allergies: Negative for polydipsia.  Psychiatric/Behavioral: Negative for depression. The patient is not nervous/anxious.     MEDICATIONS AT HOME:   Prior to Admission medications   Medication Sig Start Date End Date Taking? Authorizing Provider  clonazePAM (KLONOPIN) 0.5 MG tablet Take 0.5 mg by mouth 2 (two) times daily as needed for anxiety.   Yes [provider]  gabapentin (NEURONTIN) 100 MG capsule Take 100 mg by mouth 3 (three) times daily. 09/19/17 09/19/18 Yes [provider]  FLUoxetine (PROZAC) 20 MG capsule Take 20 mg by mouth daily.    [provider]      VITAL SIGNS:  Blood pressure 136/85, pulse (!) 134, temperature 98.4 F (36.9 C), temperature source Oral, resp. rate 16, height 5\' 3"  (1.6 m), weight 83.9 kg, last menstrual period 02/25/2018, SpO2 97 %.  PHYSICAL EXAMINATION:  Physical Exam  GENERAL:  33 y.o.-year-old patient lying in the bed with no acute distress.  Obesity. EYES: Pupils equal, round, reactive to light and accommodation. No scleral icterus. Extraocular muscles intact.  HEENT: Head atraumatic, normocephalic. Oropharynx and nasopharynx clear.  NECK:  Supple, no  jugular venous distention. No thyroid enlargement, no tenderness.  LUNGS: Normal breath sounds bilaterally, no wheezing, rales,rhonchi or crepitation. No use of accessory muscles of respiration.  CARDIOVASCULAR: S1, S2 tachycardia. No murmurs, rubs, or gallops.  ABDOMEN: Soft, nontender, nondistended. Bowel sounds present. No organomegaly or mass.  EXTREMITIES: No pedal  edema, cyanosis, or clubbing.  NEUROLOGIC: Cranial nerves II through XII are intact. Muscle strength 5/5 in all extremities. Sensation intact. Gait not checked.  PSYCHIATRIC: The patient is alert and oriented x 3.  SKIN: No obvious rash, lesion, or ulcer.   LABORATORY PANEL:   CBC Recent Labs  Lab 03/11/18 1607  WBC 11.5*  HGB 14.1  HCT 44.3  PLT 397   ------------------------------------------------------------------------------------------------------------------  Chemistries  Recent Labs  Lab 03/11/18 1638  NA 140  K 3.7  CL 104  CO2 28  GLUCOSE 81  BUN 11  CREATININE 0.80  CALCIUM 9.5   ------------------------------------------------------------------------------------------------------------------  Cardiac Enzymes Recent Labs  Lab 03/11/18 1638  TROPONINI <0.03   ------------------------------------------------------------------------------------------------------------------  RADIOLOGY:  Ct Angio Chest Pe W And/or Wo Contrast  Result Date: 03/11/2018 CLINICAL DATA:  Shortness of breath EXAM: CT ANGIOGRAPHY CHEST WITH CONTRAST TECHNIQUE: Multidetector CT imaging of the chest was performed using the standard protocol during bolus administration of intravenous contrast. Multiplanar CT image reconstructions and MIPs were obtained to evaluate the vascular anatomy. CONTRAST:  77mL OMNIPAQUE IOHEXOL 350 MG/ML SOLN COMPARISON:  Plain film from earlier in the same day. FINDINGS: Cardiovascular: Thoracic aorta is within normal limits. The pulmonary artery is well visualized within normal branching pattern. No definitive filling defect to suggest pulmonary embolism is seen. Mediastinum/Nodes: Thoracic inlet is within normal limits. No hilar or mediastinal adenopathy is noted. The esophagus is within normal limits. Lungs/Pleura: Lungs are well aerated bilaterally. No focal infiltrate or sizable effusion is seen. No parenchymal nodule is noted. Upper Abdomen: No acute  abnormality. Musculoskeletal: Degenerative changes of the thoracic spine are noted. Review of the MIP images confirms the above findings. IMPRESSION: No evidence of pulmonary emboli. No acute abnormality noted. Electronically Signed   By: Alcide Clever M.D.   On: 03/11/2018 17:51   Dg Chest Port 1 View  Result Date: 03/11/2018 CLINICAL DATA:  Cough and shortness of breath EXAM: PORTABLE CHEST 1 VIEW COMPARISON:  December 08, 2017 FINDINGS: There is no appreciable edema or consolidation. The heart size and pulmonary vascularity are normal. No adenopathy. There is degenerative change in the thoracic spine. IMPRESSION: No edema or consolidation. Electronically Signed   By: Bretta Bang III M.D.   On: 03/11/2018 16:30      IMPRESSION AND PLAN:   Sinus tachycardia and shortness of breath. The patient will be placed for observation.  Telemetry monitor, start Lopressor bid.  Echocardiograph and cardiology consult. Check urine analysis and drug screen.  Anxiety.  Klonopin as needed.  Obesity.  Diet control and follow-up PCP.  All the records are reviewed and case discussed with ED provider. Management plans discussed with the patient, family and they are in agreement.  CODE STATUS: Full code.  TOTAL TIME TAKING CARE OF THIS PATIENT: 32 minutes.    Shaune Pollack M.D on 03/11/2018 at 6:54 PM  Between 7am to 6pm - Pager - [phone]  After 6pm go to [email or web address] - Social research officer, government  [employer] [place] [facility]  Office  [phone]  CC: Primary care physician; Nell Range, Ruben Gottron, MD   Note: This dictation was prepared with Dragon dictation along with smaller phrase technology. Any transcriptional errors  that result from this process are unin

## 2018-03-11 NOTE — ED Triage Notes (Signed)
Pt reports  She has been having shorthness of breath for past 2 dayse, denies any fever reports headache on the back of her head, also reports right leg pain denies any injuries. Pt talks in complete sentences no respiratory distress noted

## 2018-03-11 NOTE — ED Notes (Signed)
2A states assigning bed now. Will call RN back soon.

## 2018-03-11 NOTE — ED Notes (Signed)
Admitting at bedside 

## 2018-03-11 NOTE — ED Provider Notes (Signed)
[facility] Emergency Department Provider Note   ____________________________________________    I have reviewed the triage vital signs and the nursing notes.   HISTORY  Chief Complaint Shortness of Breath     HPI Alison Mathews is a 33 y.o. female presents with shortness of breath.  Patient reports shortness of breath began last night but became much worse at work this morning.  She denies significant pleurisy.  No history of smoking.  Mild cough.  No significant body aches but does describe posterior headache.  No history of asthma.  Has not take anything for this.  Denies chest pain.  No recent travel.  No history of blood clots.  No flu shot this year  Past Medical History:  Diagnosis Date  . Anxiety   . Gall stones     There are no active problems to display for this patient.   No past surgical history on file.  Prior to Admission medications   Medication Sig Start Date End Date Taking? Authorizing Provider  clonazePAM (KLONOPIN) 0.5 MG tablet Take 0.5 mg by mouth 2 (two) times daily as needed for anxiety.   Yes [provider]  gabapentin (NEURONTIN) 100 MG capsule Take 100 mg by mouth 3 (three) times daily. 09/19/17 09/19/18 Yes [provider]  FLUoxetine (PROZAC) 20 MG capsule Take 20 mg by mouth daily.    [provider]     Allergies Motrin [ibuprofen]  No family history on file.  Social History Social History   Tobacco Use  . Smoking status: Never Smoker  . Smokeless tobacco: Never Used  Substance Use Topics  . Alcohol use: No  . Drug use: No    Review of Systems  Constitutional: No fever/chills Eyes: No visual changes.  ENT: No sore throat. Cardiovascular: Denies chest pain. Respiratory: As above Gastrointestinal: No abdominal pain.  Genitourinary: Negative for dysuria. Musculoskeletal: No significant myalgias Skin: No rash Neurological: Mild posterior  headache   ____________________________________________   PHYSICAL EXAM:  VITAL SIGNS: ED Triage Vitals  Enc Vitals Group     BP 03/11/18 1551 (!) 145/76     Pulse Rate 03/11/18 1551 (!) 147     Resp 03/11/18 1551 20     Temp 03/11/18 1551 98.4 F (36.9 C)     Temp Source 03/11/18 1551 Oral     SpO2 03/11/18 1551 96 %     Weight 03/11/18 1553 83.9 kg (185 lb)     Height 03/11/18 1553 1.6 m (5\' 3" )     Head Circumference --      Peak Flow --      Pain Score 03/11/18 1551 10     Pain Loc --      Pain Edu? --      Excl. in GC? --     Constitutional: Alert and oriented. Eyes: Conjunctivae are normal.   Nose: No congestion/rhinnorhea. Mouth/Throat: Mucous membranes are moist.    Cardiovascular: Significant tachycardia, regular rhythm. Grossly normal heart sounds.  Good peripheral circulation. Respiratory: Increased respiratory effort with tachypnea no retractions.  No wheezes Gastrointestinal: Soft and nontender. No distention.   Genitourinary: deferred Musculoskeletal: No lower extremity tenderness nor edema.  Warm and well perfused Neurologic:  Normal speech and language. No gross focal neurologic deficits are appreciated.  Skin:  Skin is warm, dry and intact. No rash noted. Psychiatric: Mood and affect are normal. Speech and behavior are normal.  ____________________________________________   LABS (all labs ordered are listed, but  only abnormal results are displayed)  Labs Reviewed  CBC - Abnormal; Notable for the following components:      Result Value   WBC 11.5 (*)    All other components within normal limits  INFLUENZA PANEL BY PCR (TYPE A & B)  LACTIC ACID, PLASMA  BASIC METABOLIC PANEL  TROPONIN I  LACTIC ACID, PLASMA  URINALYSIS, COMPLETE (UACMP) WITH MICROSCOPIC  URINE DRUG SCREEN, QUALITATIVE ([facility] ONLY)  POC URINE PREG, ED   ____________________________________________  EKG  ED ECG REPORT I, Jene Everyobert Avelina Mcclurkin, the attending physician, personally  viewed and interpreted this ECG.  Date: 03/11/2018  Rhythm: Sinus tachycardia QRS Axis: normal Intervals: normal ST/T Wave abnormalities: normal Narrative Interpretation: no evidence of acute ischemia  ____________________________________________  RADIOLOGY  Chest x-ray ____________________________________________   PROCEDURES  Procedure(s) performed: No  Procedures   Critical Care performed: No ____________________________________________   INITIAL IMPRESSION / ASSESSMENT AND PLAN / ED COURSE  Pertinent labs & imaging results that were available during my care of the patient were reviewed by me and considered in my medical decision making (see chart for details).  Patient presents with shortness of breath.  She is afebrile.  Did not receive a flu shot this year.  Differential includes pneumonia, possibly flu related, bronchospasm although no significant wheezing, PE.  Labs including lactic and d-dimer pending.  Chest x-ray pending.  Will give IV fluids, DuoNeb and carefully monitor  ----------------------------------------- 6:32 PM on 03/11/2018 -----------------------------------------  Lab work is unremarkable, lactic normal, troponin normal, mild elevation of white blood cell count is very nonspecific.  Influenza is negative.  CT angiography is negative for PE or pneumonia.  Patient has received a liter of IV fluids with little improvement in heart rate.  She continues to feel short of breath.  No wheezing on exam.  She will require admission for further evaluation    ____________________________________________   FINAL CLINICAL IMPRESSION(S) / ED DIAGNOSES  Final diagnoses:  Shortness of breath        Note:  This document was prepared using Dragon voice recognition software and may include unintentional dictation errors.   Jene EveryKinner, Parag Dorton, MD 03/11/18 [phone]

## 2018-03-12 ENCOUNTER — Observation Stay
Admit: 2018-03-12 | Discharge: 2018-03-12 | Disposition: A | Discharge status: Home / Self Care | Payer: Self-pay | Attending: Internal Medicine | Admitting: Internal Medicine

## 2018-03-12 MED ORDER — METOCLOPRAMIDE HCL 5 MG/ML IJ SOLN
10.0000 mg | Freq: Four times a day (QID) | INTRAMUSCULAR | Status: DC | PRN
  Administered 2018-03-12: 10 mg via INTRAVENOUS
  Filled 2018-03-12: qty 2

## 2018-03-12 MED ORDER — METOPROLOL TARTRATE 25 MG PO TABS: 12.5000 mg | ORAL_TABLET | Freq: Two times a day (BID) | ORAL | 0 refills | Status: DC

## 2018-03-12 NOTE — Progress Notes (Signed)
Pt reports "falling OOB" (oob=out of bed) this morning during bedside shift change at approximately 0715-0730. Prior to this claim, pt was sitting in bed, cardiologist at bedside discussing plan of care with Pt. Pt's bed alarm had been intermittently sounding during the course of the conversation between MD and pt, & staff nurse was sitting at computer just outside pt's room watching the telemetry monitor to review this pt's HR & rhythm. Staff RN went into room at this time and turned bed alarm off, so that MD & pt could continue discussion uninterrupted.  Pt stated that her fall occurred prior to cardiologist entering room, but bed alarm was on prior to this incident and RN was outside pt's room. When asked to clarify what happened, pt stated that she slid OOB and struck her right lateral thigh.  There was no noise to indicate pt fell or slid OOB, and no changes on the telemetry monitor during the period of time that the pt states fall occurred, and the alarm did not sound. Pt proceeded to request pain medication for her right thigh after vitals were obtained & examination done. Pt had received IV Toradol approximately 30 minutes prior to stated incident for pain in the lower right leg (anterior medial shin area) that pt states she struck during a fall prior to admission. MD and CN notified, MD to examine pt also. Pt requested her klonopin and oncoming RN to administer.   Yevonne Aline, RN

## 2018-03-12 NOTE — Progress Notes (Signed)
Patient is refusing to allow staff in the bathroom with her.  We stood outside the door while she urinated and then Rn helped her back into bed and reset the alarm.  One side rail is up on the side she reported sliding from.  Bed alarm is on at all times.  Patient reported feeling like she would be sick, her stomach hurts.  RN gave her Zofran and notified MD.  Patient refused her psychiatric medications.  She said she is no longer taking them. She refused them yesterday as well (RN checked the [facility]).  Patient requests RN presence in the room repeatedly.  RN has been in her room every 15-20 minutes per patient request since shift change.  Christean Grief, RN

## 2018-03-12 NOTE — Discharge Summary (Signed)
Alison Mathews, is a 33 y.o. female  DOB 1985-05-07  MRN [id number].  Admission date:  03/11/2018  Admitting Physician  Shaune Pollack, MD  Discharge Date:  03/12/2018   Primary MD  Cooner, Ruben Gottron, MD  Recommendations for primary care physician for things to follow:   Follow with PCP in 1 week   Admission Diagnosis  Shortness of breath [R06.02] Cough [R05]   Discharge Diagnosis  Shortness of breath [R06.02] Cough [R05]    Active Problems:   Sinus tachycardia      Past Medical History:  Diagnosis Date  . Anxiety   . Gall stones     History reviewed. No pertinent surgical history.     History of present illness and  Hospital Course:     Kindly see H&P for history of present illness and admission details, please review complete Labs, Consult reports and Test reports for all details in brief  HPI  from the history and physical done on the day of admission  33 year old female with a history of anxiety admitted because of shortness of breath, chest pain found to have tachycardia so admitted to telemetry.  Hospital Course  #1 sinus tachycardia likely due to anxiety, seen by cardiology, Dr. Gwen Pounds, patient monitored on telemetry, did not have any arrhythmias, started on low-dose beta-blocker, patient is feeling much better, heart rate is around 90s to 100 but she feels better, follow echocardiogram if echo is within normal limits discharge the patient home is agreeable, will give a limited prescription of metoprolol 12.5 mg p.o. twice daily, advised her to follow-up with PCP in 1 week to see whether she can be off of beta-blocker.  If the problem persist patient needs to EP referral.  2. #2 anxiety: Continue Klonopin, Neurontin, 3.  Polysubstance abuse, urine toxicology is positive for opiates,  antidepressants. States that she has right knee pain because she fell and hit the concrete can use Tylenol and does not want Motrin.   Discharge Condition: Stable   Follow UP  Follow-up Information    Cooner, Ruben Gottron, MD Follow up.   Specialty:  Internal Medicine Why:  Can call PCP and make that appointment tomorrow. Contact information: [address] [place] [address] [phone]             Discharge Instructions  and  Discharge Medications    Allergies as of 03/12/2018      Reactions   Motrin [ibuprofen] Nausea And Vomiting      Medication List    TAKE these medications   clonazePAM 0.5 MG tablet Commonly known as:  KLONOPIN Take 0.5 mg by mouth 2 (two) times daily as needed for anxiety.   FLUoxetine 20 MG capsule Commonly known as:  PROZAC Take 20 mg by mouth daily.   gabapentin 100 MG capsule Commonly known as:  NEURONTIN Take 100 mg by mouth 3 (three) times daily.   metoprolol tartrate 25 MG tablet Commonly known as:  LOPRESSOR Take 0.5 tablets (12.5 mg total) by mouth 2 (two) times daily.         Diet and Activity recommendation: See Discharge Instructions above   Consults obtained -cardiology  Major procedures and Radiology Reports - PLEASE review detailed and final reports for all details, in brief -      Ct Angio Chest Pe W And/or Wo Contrast  Result Date: 03/11/2018 CLINICAL DATA:  Shortness of breath EXAM: CT ANGIOGRAPHY CHEST WITH CONTRAST TECHNIQUE: Multidetector CT imaging of the  chest was performed using the standard protocol during bolus administration of intravenous contrast. Multiplanar CT image reconstructions and MIPs were obtained to evaluate the vascular anatomy. CONTRAST:  75mL OMNIPAQUE IOHEXOL 350 MG/ML SOLN COMPARISON:  Plain film from earlier in the same day. FINDINGS: Cardiovascular: Thoracic aorta is within normal limits. The pulmonary artery is well visualized within normal branching pattern. No  definitive filling defect to suggest pulmonary embolism is seen. Mediastinum/Nodes: Thoracic inlet is within normal limits. No hilar or mediastinal adenopathy is noted. The esophagus is within normal limits. Lungs/Pleura: Lungs are well aerated bilaterally. No focal infiltrate or sizable effusion is seen. No parenchymal nodule is noted. Upper Abdomen: No acute abnormality. Musculoskeletal: Degenerative changes of the thoracic spine are noted. Review of the MIP images confirms the above findings. IMPRESSION: No evidence of pulmonary emboli. No acute abnormality noted. Electronically Signed   By: Alcide CleverMark  Lukens M.D.   On: 03/11/2018 17:51   Dg Chest Port 1 View  Result Date: 03/11/2018 CLINICAL DATA:  Cough and shortness of breath EXAM: PORTABLE CHEST 1 VIEW COMPARISON:  December 08, 2017 FINDINGS: There is no appreciable edema or consolidation. The heart size and pulmonary vascularity are normal. No adenopathy. There is degenerative change in the thoracic spine. IMPRESSION: No edema or consolidation. Electronically Signed   By: Bretta BangWilliam  Woodruff III M.D.   On: 03/11/2018 16:30    Micro Results     No results found for this or any previous visit (from the past 240 hour(s)).     Today   Subjective:   Alison Mathews today has no headache,no chest abdominal pain,no new weakness tingling or numbness, feels much better wants to go home today.   Objective:   Blood pressure 105/61, pulse (!) 101, temperature (!) 97.5 F (36.4 C), temperature source Oral, resp. rate 18, height 5\' 3"  (1.6 m), weight 91.5 kg, last menstrual period 02/25/2018, SpO2 100 %.   Intake/Output Summary (Last 24 hours) at 03/12/2018 1031 Last data filed at 03/12/2018 1011 Gross per 24 hour  Intake 120 ml  Output 450 ml  Net -330 ml    Exam Awake Alert, Oriented x 3, No new F.N deficits, Normal affect [place].AT,PERRAL Supple Neck,No JVD, No cervical lymphadenopathy appriciated.  Symmetrical Chest wall movement, Good air  movement bilaterally, CTAB RRR,No Gallops,Rubs or new Murmurs, No Parasternal Heave +ve B.Sounds, Abd Soft, Non tender, No organomegaly appriciated, No rebound -guarding or rigidity. No Cyanosis, Clubbing or edema, No new Rash or bruise  Data Review   CBC w Diff:  Lab Results  Component Value Date   WBC 11.5 (H) 03/11/2018   HGB 14.1 03/11/2018   HGB 14.5 06/17/2014   HCT 44.3 03/11/2018   HCT 44.0 06/17/2014   PLT 397 03/11/2018   PLT 377 06/17/2014   LYMPHOPCT 5 10/13/2015   LYMPHOPCT 20.7 05/27/2014   MONOPCT 5 10/13/2015   MONOPCT 4.1 05/27/2014   EOSPCT 0 10/13/2015   EOSPCT 2.7 05/27/2014   BASOPCT 0 10/13/2015   BASOPCT 0.9 05/27/2014    CMP:  Lab Results  Component Value Date   NA 140 03/11/2018   NA 137 06/17/2014   K 3.7 03/11/2018   K 4.2 06/17/2014   CL 104 03/11/2018   CL 105 06/17/2014   CO2 28 03/11/2018   CO2 28 06/17/2014   BUN 11 03/11/2018   BUN 6 06/17/2014   CREATININE 0.80 03/11/2018   CREATININE 0.67 06/17/2014   PROT 7.7 01/01/2018   PROT 8.2 (H) 06/17/2014  ALBUMIN 4.1 01/01/2018   ALBUMIN 4.5 06/17/2014   BILITOT 0.4 01/01/2018   BILITOT 0.3 06/17/2014   ALKPHOS 44 01/01/2018   ALKPHOS 48 06/17/2014   AST 23 01/01/2018   AST 27 06/17/2014   ALT 27 01/01/2018   ALT 22 06/17/2014  .   Total Time in preparing paper work, data evaluation and todays exam - 35 minutes  Katha Hamming M.D on 03/12/2018 at 10:31 AM    Note: This dictation was prepared with Dragon dictation along with smaller phrase technology. Any transcriptional errors that result from this process are unintentional.

## 2018-03-12 NOTE — Progress Notes (Signed)
This RN called to the room by patient's sister, who stated the patient could not breathe. The patient was vomiting over the trash can and complained of feeling tight. This RN got an order for another nausea medicine for breakthrough nausea/vomiting.  Then RT will be engaged once nausea is decreased.

## 2018-03-12 NOTE — Consult Note (Signed)
[facility] Consultation [facility]  Patient ID: Alison Mathews, MRN: [id number], DOB/AGE: Sep 05, 1985 33 y.o. Admit date: 03/11/2018   Date of Consult: 03/12/2018 Primary Physician: Tessie Fassooner, Edward William, MD Primary Cardiologist: None  Chief Complaint:  Chief Complaint  Patient presents with  . Shortness of Breath   Reason for Consult: Tachycardia  HPI: 33 y.o. female with no previous cardiovascular history who has had some reports of feeling somewhat weak and had EKG showing sinus tachycardia to 130 to 140bpm without any potential primary source.  CT scan chest x-ray and other evaluation showed no evidence of primary cause.  She has been admitted to telemetry for observation given mild amount of metoprolol with improvements of tachycardia.  She is appears to be still weak and fatigued but there is no evidence of chest pain or congestive heart failure type symptoms today.  Past Medical History:  Diagnosis Date  . Anxiety   . Gall stones       Surgical History: History reviewed. No pertinent surgical history.   Home Meds: Prior to Admission medications   Medication Sig Start Date End Date Taking? Authorizing Provider  clonazePAM (KLONOPIN) 0.5 MG tablet Take 0.5 mg by mouth 2 (two) times daily as needed for anxiety.   Yes [provider]  gabapentin (NEURONTIN) 100 MG capsule Take 100 mg by mouth 3 (three) times daily. 09/19/17 09/19/18 Yes [provider]  FLUoxetine (PROZAC) 20 MG capsule Take 20 mg by mouth daily.    [provider]    Inpatient Medications:  . enoxaparin (LOVENOX) injection  40 mg Subcutaneous Q24H  . FLUoxetine  20 mg Oral Daily  . gabapentin  100 mg Oral TID  . Influenza vac split quadrivalent PF  0.5 mL Intramuscular Tomorrow-1000  . metoprolol tartrate  12.5 mg Oral BID  . sodium chloride flush  3 mL Intravenous Q12H   . sodium chloride      Allergies:  Allergies  Allergen Reactions  . Motrin [Ibuprofen] Nausea  And Vomiting    Social History   Socioeconomic History  . Marital status: Single    Spouse name: Not on file  . Number of children: Not on file  . Years of education: Not on file  . Highest education level: Not on file  Occupational History  . Not on file  Social Needs  . Financial resource strain: Not on file  . Food insecurity:    Worry: Not on file    Inability: Not on file  . Transportation needs:    Medical: Not on file    Non-medical: Not on file  Tobacco Use  . Smoking status: Never Smoker  . Smokeless tobacco: Never Used  Substance and Sexual Activity  . Alcohol use: No  . Drug use: No  . Sexual activity: Not on file  Lifestyle  . Physical activity:    Days per week: Not on file    Minutes per session: Not on file  . Stress: Not on file  Relationships  . Social connections:    Talks on phone: Not on file    Gets together: Not on file    Attends religious service: Not on file    Active member of club or organization: Not on file    Attends meetings of clubs or organizations: Not on file    Relationship status: Not on file  . Intimate partner violence:    Fear of current or ex partner: Not on file    Emotionally abused:  Not on file    Physically abused: Not on file    Forced sexual activity: Not on file  Other Topics Concern  . Not on file  Social History Narrative  . Not on file     Family History  Problem Relation Age of Onset  . Cervical cancer Mother   . Hypertension Mother      Review of Systems Positive for weak fatigue Negative for: General:  chills, fever, night sweats or weight changes.  Cardiovascular: PND orthopnea syncope dizziness  Dermatological skin lesions rashes Respiratory: Cough congestion Urologic: Frequent urination urination at night and hematuria Abdominal: negative for nausea, vomiting, diarrhea, bright red blood per rectum, melena, or hematemesis Neurologic: negative for visual changes, and/or hearing changes  All  other systems reviewed and are otherwise negative except as noted above.  Labs: Recent Labs    03/11/18 1638  TROPONINI <0.03   Lab Results  Component Value Date   WBC 11.5 (H) 03/11/2018   HGB 14.1 03/11/2018   HCT 44.3 03/11/2018   MCV 88.6 03/11/2018   PLT 397 03/11/2018    Recent Labs  Lab 03/11/18 1638  NA 140  K 3.7  CL 104  CO2 28  BUN 11  CREATININE 0.80  CALCIUM 9.5  GLUCOSE 81   No results found for: CHOL, HDL, LDLCALC, TRIG No results found for: DDIMER  Radiology/Studies:  Ct Angio Chest Pe W And/or Wo Contrast  Result Date: 03/11/2018 CLINICAL DATA:  Shortness of breath EXAM: CT ANGIOGRAPHY CHEST WITH CONTRAST TECHNIQUE: Multidetector CT imaging of the chest was performed using the standard protocol during bolus administration of intravenous contrast. Multiplanar CT image reconstructions and MIPs were obtained to evaluate the vascular anatomy. CONTRAST:  75mL OMNIPAQUE IOHEXOL 350 MG/ML SOLN COMPARISON:  Plain film from earlier in the same day. FINDINGS: Cardiovascular: Thoracic aorta is within normal limits. The pulmonary artery is well visualized within normal branching pattern. No definitive filling defect to suggest pulmonary embolism is seen. Mediastinum/Nodes: Thoracic inlet is within normal limits. No hilar or mediastinal adenopathy is noted. The esophagus is within normal limits. Lungs/Pleura: Lungs are well aerated bilaterally. No focal infiltrate or sizable effusion is seen. No parenchymal nodule is noted. Upper Abdomen: No acute abnormality. Musculoskeletal: Degenerative changes of the thoracic spine are noted. Review of the MIP images confirms the above findings. IMPRESSION: No evidence of pulmonary emboli. No acute abnormality noted. Electronically Signed   By: Alcide CleverMark  Lukens M.D.   On: 03/11/2018 17:51   Dg Chest Port 1 View  Result Date: 03/11/2018 CLINICAL DATA:  Cough and shortness of breath EXAM: PORTABLE CHEST 1 VIEW COMPARISON:  December 08, 2017  FINDINGS: There is no appreciable edema or consolidation. The heart size and pulmonary vascularity are normal. No adenopathy. There is degenerative change in the thoracic spine. IMPRESSION: No edema or consolidation. Electronically Signed   By: Bretta BangWilliam  Woodruff III M.D.   On: 03/11/2018 16:30    EKG: Sinus tachycardia  Weights: Filed Weights   03/11/18 1553 03/11/18 2019  Weight: 83.9 kg 91.5 kg     Physical Exam: Blood pressure 111/75, pulse 89, temperature (!) 97.5 F (36.4 C), temperature source Oral, resp. rate 16, height 5\' 3"  (1.6 m), weight 91.5 kg, last menstrual period 02/25/2018, SpO2 99 %. Body mass index is 35.73 kg/m. General: Well developed, well nourished, in no acute distress. Head eyes ears nose throat: Normocephalic, atraumatic, sclera non-icteric, no xanthomas, nares are without discharge. No apparent thyromegaly and/or mass  Lungs: Normal  respiratory effort.  no wheezes, no rales, no rhonchi.  Heart: RRR with normal S1 S2. no murmur gallop, no rub, PMI is normal size and placement, carotid upstroke normal without bruit, jugular venous pressure is normal Abdomen: Soft, non-tender, non-distended with normoactive bowel sounds. No hepatomegaly. No rebound/guarding. No obvious abdominal masses. Abdominal aorta is normal size without bruit Extremities: No edema. no cyanosis, no clubbing, no ulcers  Peripheral : 2+ bilateral upper extremity pulses, 2+ bilateral femoral pulses, 2+ bilateral dorsal pedal pulse Neuro: Alert and oriented. No facial asymmetry. No focal deficit. Moves all extremities spontaneously. Musculoskeletal: Normal muscle tone without kyphosis Psych:  Responds to questions appropriately with a normal affect.    Assessment: 33 year old female with sinus tachycardia weakness and fatigue and occasional shortness of breath without evidence of heart failure or myocardial infarction  Plan: 1.  Low-dose metoprolol for sinus tachycardia while further evaluation  treatment options of possible primary cause of symptoms 2.  Consider echocardiogram for further LV systolic dysfunction valvular heart disease or other causes of above 3.  If ambulating well without evidence of significant symptoms okay for discharged home for further work-up as an outpatient  Signed, Lamar Blinks M.D. [facility] Cardiology 03/12/2018, 6:52 AM

## 2018-03-12 NOTE — Progress Notes (Signed)
   03/12/18 1200  Clinical Encounter Type  Visited With Patient and family together  Visit Type Initial  Spiritual Encounters  Spiritual Needs Brochure  Stress Factors  Patient Stress Factors Not reviewed    Chaplain received an OR to complete or update an AD. Patient resting in bed but awake, alert and watching television. Sister at the bedside. Patient indicated hopes to go home today and is feeling "a bit better." She also stated that she was interested in hearing about the AD after discussion with her nurse. Education provided to patient; no questions at this time. Patient will ask her nurse to call or page on-call chaplain with any questions or if completion desired. Chaplain also provided information on alternative means of completion after discharge.

## 2018-03-12 NOTE — Progress Notes (Signed)
Night RN and Day RN were doing bedside report.  The patient stated that she had "just fallen out of bed before the cardiologist went in her room".  She was sitting up in bed.  She did not report the "fall" to the cardiologist.  RNs immediately assessed the patient and got a set of vitals.  Patient appeared very relaxed.  Night RN was sitting outside her room while she was with the cardiologist watching her heart rate, and there were no changes.  Patient's skin looks good, no marks or abrasions.  MD was notified, she examined the patient and said the patient was fine.  When asked to clarify, patient stated she slid out of bed and hit her side.  Patient did not report the "fall" until after she was told she was being discharged.  Patient waited until the cardiologist left and mentioned that she fell and asked for pain medicine during bedside report.  The "fall" was unwitnessed while her bed alarm was on, but the alarm did not go off.  Patient requested her anxiety medicine.  RN gave it.  Christean Grief, RN

## 2018-03-13 LAB — ECHOCARDIOGRAM COMPLETE
Height: 63 in
Weight: 3227.2 oz

## 2018-03-13 NOTE — Discharge Instructions (Signed)
Shortness of Breath, Adult  Shortness of breath is when a person has trouble breathing enough air or when a person feels like she or he is having trouble breathing in enough air. Shortness of breath could be a sign of a medical problem.  Follow these instructions at home:     Pay attention to any changes in your symptoms.   Do not use any products that contain nicotine or tobacco, such as cigarettes, e-cigarettes, and chewing tobacco.   Do not smoke. Smoking is a common cause of shortness of breath. If you need help quitting, ask your health care provider.   Avoid things that can irritate your airways, such as:  ? Mold.  ? Dust.  ? Air pollution.  ? Chemical fumes.  ? Things that can cause allergy symptoms (allergens), if you have allergies.   Keep your living space clean and free of mold and dust.   Rest as needed. Slowly return to your usual activities.   Take over-the-counter and prescription medicines only as told by your health care provider. This includes oxygen therapy and inhaled medicines.   Keep all follow-up visits as told by your health care provider. This is important.  Contact a health care provider if:   Your condition does not improve as soon as expected.   You have a hard time doing your normal activities, even after you rest.   You have new symptoms.  Get help right away if:   Your shortness of breath gets worse.   You have shortness of breath when you are resting.   You feel light-headed or you faint.   You have a cough that is not controlled with medicines.   You cough up blood.   You have pain with breathing.   You have pain in your chest, arms, shoulders, or abdomen.   You have a fever.   You cannot walk up stairs or exercise the way that you normally do.  These symptoms may represent a serious problem that is an emergency. Do not wait to see if the symptoms will go away. Get medical help right away. Call your local emergency services (911 in the [place] Do not drive yourself  to the hospital.  Summary   Shortness of breath is when a person has trouble breathing enough air. It can be a sign of a medical problem.   Avoid things that irritate your lungs, such as smoking, pollution, mold, and dust.   Pay attention to changes in your symptoms and contact your health care provider if you have a hard time completing daily activities because of shortness of breath.  This information is not intended to replace advice given to you by your health care provider. Make sure you discuss any questions you have with your health care provider.  Document Released: 11/03/2000 Document Revised: 07/11/2017 Document Reviewed: 07/11/2017  Elsevier Interactive Patient Education  2019 [employer]

## 2018-03-13 NOTE — Discharge Summary (Signed)
Alison Mathews, is a 33 y.o. female  DOB 1985-03-20  MRN [id number].  Admission date:  03/11/2018  Admitting Physician  Shaune PollackQing Chen, MD  Discharge Date:  03/13/2018   Primary MD  Cooner, Ruben GottronEdward William, MD  Recommendations for primary care physician for things to follow:   Follow with PCP in 1 week   Admission Diagnosis  Shortness of breath [R06.02] Cough [R05]   Discharge Diagnosis  Shortness of breath [R06.02] Cough [R05]    Active Problems:   Sinus tachycardia      Past Medical History:  Diagnosis Date  . Anxiety   . Gall stones     History reviewed. No pertinent surgical history.     History of present illness and  Hospital Course:     Kindly see H&P for history of present illness and admission details, please review complete Labs, Consult reports and Test reports for all details in brief  HPI  from the history and physical done on the day of admission  33 year old female with a history of anxiety admitted because of shortness of breath, chest pain found to have tachycardia so admitted to telemetry.  Hospital Course  #1 sinus tachycardia likely due to anxiety, seen by cardiology, Dr. Gwen PoundsKowalski, patient monitored on telemetry, did not have any arrhythmias, started on low-dose beta-blocker, patient is feeling much better, heart rate is around 90s to 100 but she feels better, echo showed EF more than 55%.  . Will give a limited prescription of metoprolol 12.5 mg p.o. twice daily, advised her to follow-up with PCP in 1 week to see whether she can be off of beta-blocker.  If the problem persist patient needs to EP referral.  2. #2 anxiety: Continue Klonopin, Neurontin, 3.  Polysubstance abuse, urine toxicology is positive for opiates, antidepressants. States that she has right knee pain because she fell and hit  the concrete can use Tylenol and does not want Motrin.   Discharge Condition: Stable   Follow UP  Follow-up Information    Cooner, Ruben GottronEdward William, MD.   Specialty:  Internal Medicine Why:  Can call PCP and make that appointment tomorrow., Office iws closed pt will have to make appt. Contact information: [address] [place] [id number] [phone]             Discharge Instructions  and  Discharge Medications    Allergies as of 03/13/2018      Reactions   Motrin [ibuprofen] Nausea And Vomiting      Medication List    TAKE these medications   clonazePAM 0.5 MG tablet Commonly known as:  KLONOPIN Take 0.5 mg by mouth 2 (two) times daily as needed for anxiety.   FLUoxetine 20 MG capsule Commonly known as:  PROZAC Take 20 mg by mouth daily.   gabapentin 100 MG capsule Commonly known as:  NEURONTIN Take 100 mg by mouth 3 (three) times daily.   metoprolol tartrate 25 MG tablet Commonly known as:  LOPRESSOR Take 0.5 tablets (12.5 mg total) by mouth 2 (two) times daily.         Diet and Activity recommendation: See Discharge Instructions above   Consults obtained -cardiology  Major procedures and Radiology Reports - PLEASE review detailed and final reports for all details, in brief -      Ct Angio Chest Pe W And/or Wo Contrast  Result Date: 03/11/2018 CLINICAL DATA:  Shortness of breath EXAM: CT ANGIOGRAPHY CHEST WITH CONTRAST TECHNIQUE: Multidetector CT imaging of  the chest was performed using the standard protocol during bolus administration of intravenous contrast. Multiplanar CT image reconstructions and MIPs were obtained to evaluate the vascular anatomy. CONTRAST:  75mL OMNIPAQUE IOHEXOL 350 MG/ML SOLN COMPARISON:  Plain film from earlier in the same day. FINDINGS: Cardiovascular: Thoracic aorta is within normal limits. The pulmonary artery is well visualized within normal branching pattern. No definitive filling defect to suggest  pulmonary embolism is seen. Mediastinum/Nodes: Thoracic inlet is within normal limits. No hilar or mediastinal adenopathy is noted. The esophagus is within normal limits. Lungs/Pleura: Lungs are well aerated bilaterally. No focal infiltrate or sizable effusion is seen. No parenchymal nodule is noted. Upper Abdomen: No acute abnormality. Musculoskeletal: Degenerative changes of the thoracic spine are noted. Review of the MIP images confirms the above findings. IMPRESSION: No evidence of pulmonary emboli. No acute abnormality noted. Electronically Signed   By: Alcide Clever M.D.   On: 03/11/2018 17:51   Dg Chest Port 1 View  Result Date: 03/11/2018 CLINICAL DATA:  Cough and shortness of breath EXAM: PORTABLE CHEST 1 VIEW COMPARISON:  December 08, 2017 FINDINGS: There is no appreciable edema or consolidation. The heart size and pulmonary vascularity are normal. No adenopathy. There is degenerative change in the thoracic spine. IMPRESSION: No edema or consolidation. Electronically Signed   By: Bretta Bang III M.D.   On: 03/11/2018 16:30    Micro Results     No results found for this or any previous visit (from the past 240 hour(s)).     Today   Subjective:   Shantelle Alles today has no headache,no chest abdominal pain,no new weakness tingling or numbness, feels much better wants to go home today.   Objective:   Blood pressure 98/69, pulse (!) 104, temperature 98 F (36.7 C), temperature source Oral, resp. rate 20, height 5\' 3"  (1.6 m), weight 91.5 kg, last menstrual period 02/25/2018, SpO2 99 %.   Intake/Output Summary (Last 24 hours) at 03/13/2018 0849 Last data filed at 03/13/2018 0833 Gross per 24 hour  Intake 483 ml  Output 400 ml  Net 83 ml    Exam Awake Alert, Oriented x 3, No new F.N deficits, Normal affect [place].AT,PERRAL Supple Neck,No JVD, No cervical lymphadenopathy appriciated.  Symmetrical Chest wall movement, Good air movement bilaterally, CTAB RRR,No Gallops,Rubs or  new Murmurs, No Parasternal Heave +ve B.Sounds, Abd Soft, Non tender, No organomegaly appriciated, No rebound -guarding or rigidity. No Cyanosis, Clubbing or edema, No new Rash or bruise  Data Review   CBC w Diff:  Lab Results  Component Value Date   WBC 11.5 (H) 03/11/2018   HGB 14.1 03/11/2018   HGB 14.5 06/17/2014   HCT 44.3 03/11/2018   HCT 44.0 06/17/2014   PLT 397 03/11/2018   PLT 377 06/17/2014   LYMPHOPCT 5 10/13/2015   LYMPHOPCT 20.7 05/27/2014   MONOPCT 5 10/13/2015   MONOPCT 4.1 05/27/2014   EOSPCT 0 10/13/2015   EOSPCT 2.7 05/27/2014   BASOPCT 0 10/13/2015   BASOPCT 0.9 05/27/2014    CMP:  Lab Results  Component Value Date   NA 140 03/11/2018   NA 137 06/17/2014   K 3.7 03/11/2018   K 4.2 06/17/2014   CL 104 03/11/2018   CL 105 06/17/2014   CO2 28 03/11/2018   CO2 28 06/17/2014   BUN 11 03/11/2018   BUN 6 06/17/2014   CREATININE 0.80 03/11/2018   CREATININE 0.67 06/17/2014   PROT 7.7 01/01/2018   PROT 8.2 (H) 06/17/2014  ALBUMIN 4.1 01/01/2018   ALBUMIN 4.5 06/17/2014   BILITOT 0.4 01/01/2018   BILITOT 0.3 06/17/2014   ALKPHOS 44 01/01/2018   ALKPHOS 48 06/17/2014   AST 23 01/01/2018   AST 27 06/17/2014   ALT 27 01/01/2018   ALT 22 06/17/2014  .   Total Time in preparing paper work, data evaluation and todays exam - 35 minutes  Katha HammingSnehalatha Deng Kemler M.D on 03/13/2018 at 8:49 AM    Note: This dictation was prepared with Dragon dictation along with smaller phrase technology. Any transcriptional errors that result from this process are unintentional.

## 2018-03-14 LAB — HIV ANTIBODY (ROUTINE TESTING W REFLEX): HIV Screen 4th Generation wRfx: NONREACTIVE

## 2018-08-21 ENCOUNTER — Emergency Department
Admission: EM | Admit: 2018-08-21 | Discharge: 2018-08-21 | Disposition: A | Discharge status: Home / Self Care | Payer: Self-pay | Attending: Emergency Medicine | Admitting: Emergency Medicine

## 2018-08-21 ENCOUNTER — Other Ambulatory Visit: Payer: Self-pay

## 2018-08-21 DIAGNOSIS — Z79899 Other long term (current) drug therapy: Secondary | ICD-10-CM | POA: Insufficient documentation

## 2018-08-21 DIAGNOSIS — R55 Syncope and collapse: Secondary | ICD-10-CM | POA: Insufficient documentation

## 2018-08-21 LAB — BASIC METABOLIC PANEL
Anion gap: 7 (ref 5–15)
BUN: 9 mg/dL (ref 6–20)
CO2: 25 mmol/L (ref 22–32)
Calcium: 8.3 mg/dL — ABNORMAL LOW (ref 8.9–10.3)
Chloride: 104 mmol/L (ref 98–111)
Creatinine, Ser: 0.9 mg/dL (ref 0.44–1.00)
GFR calc Af Amer: >60 mL/min (ref >60)
GFR calc non Af Amer: >60 mL/min (ref >60)
Glucose, Bld: 150 mg/dL — ABNORMAL HIGH (ref 70–99)
Potassium: 4.2 mmol/L (ref 3.5–5.1)
Sodium: 136 mmol/L (ref 135–145)

## 2018-08-21 LAB — CBC
HCT: 50.6 % — ABNORMAL HIGH (ref 36.0–46.0)
Hemoglobin: 16.4 g/dL — ABNORMAL HIGH (ref 12.0–15.0)
MCH: 28 pg (ref 26.0–34.0)
MCHC: 32.4 g/dL (ref 30.0–36.0)
MCV: 86.3 fL (ref 80.0–100.0)
Platelets: 364 10*3/uL (ref 150–400)
RBC: 5.86 MIL/uL — ABNORMAL HIGH (ref 3.87–5.11)
RDW: 14 % (ref 11.5–15.5)
WBC: 12.6 10*3/uL — ABNORMAL HIGH (ref 4.0–10.5)
nRBC: 0 % (ref 0.0–0.2)

## 2018-08-21 MED ORDER — SODIUM CHLORIDE 0.9 % IV BOLUS
1000.0000 mL | Freq: Once | INTRAVENOUS | Status: AC
  Administered 2018-08-21: 1000 mL via INTRAVENOUS

## 2018-08-21 MED ORDER — TRAMADOL HCL 50 MG PO TABS: 50.0000 mg | ORAL_TABLET | Freq: Four times a day (QID) | ORAL | 0 refills | Status: AC | PRN

## 2018-08-21 MED ORDER — ACETAMINOPHEN 500 MG PO TABS
1000.0000 mg | ORAL_TABLET | Freq: Once | ORAL | Status: AC
  Administered 2018-08-21: 1000 mg via ORAL
  Filled 2018-08-21: qty 2

## 2018-08-21 MED ORDER — TRAMADOL HCL 50 MG PO TABS
50.0000 mg | ORAL_TABLET | Freq: Once | ORAL | Status: AC
  Administered 2018-08-21: 50 mg via ORAL
  Filled 2018-08-21 (×2): qty 1

## 2018-08-21 NOTE — ED Notes (Signed)
Pt provided ginger ale.  

## 2018-08-21 NOTE — ED Triage Notes (Signed)
Pt arrives via EMS after donating plasma about 2 hours ago- pt did eat afterwards- pt was out shopping and wen tot the bathroom- pt passed out and woke up on floor- bp per EMS was in the 60's

## 2018-08-21 NOTE — ED Notes (Signed)
Pt states she is feeling better and would like to be DC. Informed EDP.

## 2018-08-21 NOTE — Discharge Instructions (Signed)
Return to the ER for new, worsening, or persistent lightheadedness or passing out, worsening pain in the rib, or any other new or worsening symptoms that concern you.

## 2018-08-21 NOTE — ED Provider Notes (Signed)
[facility] Emergency Department Provider Note ____________________________________________   First MD Initiated Contact with Patient 08/21/18 1627     (approximate)  I have reviewed the triage vital signs and the nursing notes.   HISTORY  Chief Complaint Hypotension    HPI Alison Mathews is a 33 y.o. female with PMH as noted below who presents with syncope, acute onset when the patient went to urinate and proceeded by feeling nauseous.  The patient states that about 2 hours prior she donated plasma.  She has done this before without any issues.  She states that she was not feeling lightheaded or weak afterwards.  She states that before this episode today she has been feeling fine.  She denies chest pain or difficulty breathing, fever, vomiting or diarrhea.  Past Medical History:  Diagnosis Date  . Anxiety   . Gall stones     Patient Active Problem List   Diagnosis Date Noted  . Sinus tachycardia 03/11/2018    History reviewed. No pertinent surgical history.  Prior to Admission medications   Medication Sig Start Date End Date Taking? Authorizing Provider  clonazePAM (KLONOPIN) 0.5 MG tablet Take 0.5 mg by mouth 2 (two) times daily as needed for anxiety.    [provider]  FLUoxetine (PROZAC) 20 MG capsule Take 20 mg by mouth daily.    [provider]  gabapentin (NEURONTIN) 100 MG capsule Take 100 mg by mouth 3 (three) times daily. 09/19/17 09/19/18  [provider]  metoprolol tartrate (LOPRESSOR) 25 MG tablet Take 0.5 tablets (12.5 mg total) by mouth 2 (two) times daily. 03/12/18   Katha HammingKonidena, Snehalatha, MD  traMADol (ULTRAM) 50 MG tablet Take 1 tablet (50 mg total) by mouth every 6 (six) hours as needed for up to 5 days. 08/21/18 08/26/18  Dionne BucySiadecki, Laurajean Hosek, MD    Allergies Motrin [ibuprofen]  Family History  Problem Relation Age of Onset  . Cervical cancer Mother   . Hypertension Mother     Social History Social  History   Tobacco Use  . Smoking status: Never Smoker  . Smokeless tobacco: Never Used  Substance Use Topics  . Alcohol use: No  . Drug use: No    Review of Systems  Constitutional: No fever/chills. Eyes: No visual changes. ENT: No sore throat. Cardiovascular: Denies chest pain. Respiratory: Denies shortness of breath. Gastrointestinal: No vomiting or diarrhea.  Genitourinary: Negative for dysuria.  Musculoskeletal: Negative for back pain. Skin: Negative for rash. Neurological: Positive for mild headache.   ____________________________________________   PHYSICAL EXAM:  VITAL SIGNS: ED Triage Vitals  Enc Vitals Group     BP 08/21/18 1616 90/66     Pulse Rate 08/21/18 1616 95     Resp 08/21/18 1616 16     Temp 08/21/18 1616 97.6 F (36.4 C)     Temp Source 08/21/18 1616 Oral     SpO2 08/21/18 1616 99 %     Weight 08/21/18 1612 184 lb (83.5 kg)     Height 08/21/18 1612 5\' 3"  (1.6 m)     Head Circumference --      Peak Flow --      Pain Score 08/21/18 1612 9     Pain Loc --      Pain Edu? --      Excl. in GC? --     Constitutional: Alert and oriented. Well appearing and in no acute distress. Eyes: Conjunctivae are normal.  Head: Atraumatic. Nose: No congestion/rhinnorhea. Mouth/Throat: Mucous membranes  are moist.   Neck: Normal range of motion.  Cardiovascular: Normal rate, regular rhythm. Good peripheral circulation. Respiratory: Normal respiratory effort.  No retractions.  Gastrointestinal: Soft and nontender.  Mild left lower rib border area tenderness.  No distention.  Genitourinary: No flank tenderness. Musculoskeletal: No lower extremity edema.  Extremities warm and well perfused.  Left forearm 5 cm bruise with no bony tenderness or deformity. Neurologic:  Normal speech and language. No gross focal neurologic deficits are appreciated.  Skin:  Skin is warm and dry. No rash noted. Psychiatric: Mood and affect are normal. Speech and behavior are normal.   ____________________________________________   LABS (all labs ordered are listed, but only abnormal results are displayed)  Labs Reviewed  BASIC METABOLIC PANEL - Abnormal; Notable for the following components:      Result Value   Glucose, Bld 150 (*)    Calcium 8.3 (*)    All other components within normal limits  CBC - Abnormal; Notable for the following components:   WBC 12.6 (*)    RBC 5.86 (*)    Hemoglobin 16.4 (*)    HCT 50.6 (*)    All other components within normal limits   ____________________________________________  EKG  ED ECG REPORT I, Dionne BucySebastian Hanna Ra, the attending physician, personally viewed and interpreted this ECG.  Date: 08/21/2018 EKG Time: 1613 Rate: 98 Rhythm: normal sinus rhythm QRS Axis: normal Intervals: normal ST/T Wave abnormalities: normal Narrative Interpretation: no evidence of acute ischemia  ____________________________________________  RADIOLOGY    ____________________________________________   PROCEDURES  Procedure(s) performed: No  Procedures  Critical Care performed: No ____________________________________________   INITIAL IMPRESSION / ASSESSMENT AND PLAN / ED COURSE  Pertinent labs & imaging results that were available during my care of the patient were reviewed by me and considered in my medical decision making (see chart for details).  33 year old female with PMH as noted above presents with syncope today with prodrome of nausea and occurring when she sat down to urinate a few hours after donating plasma.  On exam, the patient is well-appearing.  She was apparently hypotensive in the field and the blood pressure is borderline on arrival here but she has good peripheral perfusion and otherwise normal vital signs.  The remainder of the exam is unremarkable except for a bruise to the left arm from falling off of the toilet, and mild left lower rib area tenderness for the same reason.  Overall presentation is  consistent with vasovagal syncope likely related to hypovolemia from the plasma donation and micturition.  EKG is unremarkable.  I do not suspect cardiac etiology.  We will obtain basic labs, give a fluid bolus, and reassess.  ----------------------------------------- 9:03 PM on 08/21/2018 -----------------------------------------  The patient still had slight tachycardia intermittently after the first liter of NS, so I gave a second.  Tramadol was given after Tylenol did not help with the left rib pain.  She is feeling much better and her vital signs have remained stable.  She is stable for discharge home at this time.  Return precautions given, and she expresses understanding. ____________________________________________   FINAL CLINICAL IMPRESSION(S) / ED DIAGNOSES  Final diagnoses:  Syncope, unspecified syncope type      NEW MEDICATIONS STARTED DURING THIS VISIT:  New Prescriptions   TRAMADOL (ULTRAM) 50 MG TABLET    Take 1 tablet (50 mg total) by mouth every 6 (six) hours as needed for up to 5 days.     Note:  This document was prepared using Dragon  voice recognition software and may include unintentional dictation errors.    Arta Silence, MD 08/21/18 2104

## 2019-05-26 ENCOUNTER — Other Ambulatory Visit: Payer: Self-pay

## 2019-05-26 ENCOUNTER — Emergency Department: Payer: Self-pay

## 2019-05-26 ENCOUNTER — Encounter: Payer: Self-pay | Admitting: Emergency Medicine

## 2019-05-26 ENCOUNTER — Inpatient Hospital Stay
Admission: EM | Admit: 2019-05-26 | Discharge: 2019-05-28 | DRG: 392 | Disposition: A | Discharge status: Home / Self Care | Payer: Self-pay | Attending: Hospitalist | Admitting: Hospitalist

## 2019-05-26 DIAGNOSIS — Z79899 Other long term (current) drug therapy: Secondary | ICD-10-CM

## 2019-05-26 DIAGNOSIS — Z20822 Contact with and (suspected) exposure to covid-19: Secondary | ICD-10-CM | POA: Diagnosis present

## 2019-05-26 DIAGNOSIS — K760 Fatty (change of) liver, not elsewhere classified: Secondary | ICD-10-CM | POA: Diagnosis present

## 2019-05-26 DIAGNOSIS — R1011 Right upper quadrant pain: Secondary | ICD-10-CM

## 2019-05-26 DIAGNOSIS — F419 Anxiety disorder, unspecified: Secondary | ICD-10-CM | POA: Diagnosis present

## 2019-05-26 DIAGNOSIS — A084 Viral intestinal infection, unspecified: Principal | ICD-10-CM | POA: Diagnosis present

## 2019-05-26 DIAGNOSIS — K529 Noninfective gastroenteritis and colitis, unspecified: Secondary | ICD-10-CM | POA: Diagnosis present

## 2019-05-26 DIAGNOSIS — F411 Generalized anxiety disorder: Secondary | ICD-10-CM | POA: Diagnosis present

## 2019-05-26 DIAGNOSIS — Z8249 Family history of ischemic heart disease and other diseases of the circulatory system: Secondary | ICD-10-CM

## 2019-05-26 LAB — COMPREHENSIVE METABOLIC PANEL
ALT: 17 U/L (ref 0–44)
AST: 22 U/L (ref 15–41)
Albumin: 4.6 g/dL (ref 3.5–5.0)
Alkaline Phosphatase: 47 U/L (ref 38–126)
Anion gap: 9 (ref 5–15)
BUN: 13 mg/dL (ref 6–20)
CO2: 26 mmol/L (ref 22–32)
Calcium: 10 mg/dL (ref 8.9–10.3)
Chloride: 106 mmol/L (ref 98–111)
Creatinine, Ser: 0.73 mg/dL (ref 0.44–1.00)
GFR calc Af Amer: >60 mL/min (ref >60)
GFR calc non Af Amer: >60 mL/min (ref >60)
Glucose, Bld: 125 mg/dL — ABNORMAL HIGH (ref 70–99)
Potassium: 4.7 mmol/L (ref 3.5–5.1)
Sodium: 141 mmol/L (ref 135–145)
Total Bilirubin: 0.6 mg/dL (ref 0.3–1.2)
Total Protein: 8.4 g/dL — ABNORMAL HIGH (ref 6.5–8.1)

## 2019-05-26 LAB — URINALYSIS, COMPLETE (UACMP) WITH MICROSCOPIC
Bilirubin Urine: NEGATIVE
Glucose, UA: NEGATIVE mg/dL
Hgb urine dipstick: NEGATIVE
Ketones, ur: NEGATIVE mg/dL
Nitrite: NEGATIVE
Protein, ur: 100 mg/dL — AB
Specific Gravity, Urine: 1.026 (ref 1.005–1.030)
WBC, UA: >50 WBC/hpf — ABNORMAL HIGH (ref 0–5)
pH: 5 (ref 5.0–8.0)

## 2019-05-26 LAB — TROPONIN I (HIGH SENSITIVITY)
Troponin I (High Sensitivity): <2 ng/L (ref <18)
Troponin I (High Sensitivity): 2 ng/L (ref <18)

## 2019-05-26 LAB — CBC
HCT: 48.4 % — ABNORMAL HIGH (ref 36.0–46.0)
Hemoglobin: 15.8 g/dL — ABNORMAL HIGH (ref 12.0–15.0)
MCH: 28.1 pg (ref 26.0–34.0)
MCHC: 32.6 g/dL (ref 30.0–36.0)
MCV: 86.1 fL (ref 80.0–100.0)
Platelets: 454 10*3/uL — ABNORMAL HIGH (ref 150–400)
RBC: 5.62 MIL/uL — ABNORMAL HIGH (ref 3.87–5.11)
RDW: 13.2 % (ref 11.5–15.5)
WBC: 17 10*3/uL — ABNORMAL HIGH (ref 4.0–10.5)
nRBC: 0 % (ref 0.0–0.2)

## 2019-05-26 LAB — PREGNANCY, URINE: Preg Test, Ur: NEGATIVE

## 2019-05-26 LAB — LIPASE, BLOOD: Lipase: 26 U/L (ref 11–51)

## 2019-05-26 MED ORDER — GABAPENTIN 100 MG PO CAPS
100.0000 mg | ORAL_CAPSULE | Freq: Three times a day (TID) | ORAL | Status: DC
  Administered 2019-05-26 – 2019-05-28 (×5): 100 mg via ORAL
  Filled 2019-05-26 (×7): qty 1

## 2019-05-26 MED ORDER — HYDROMORPHONE HCL 1 MG/ML IJ SOLN
1.0000 mg | Freq: Once | INTRAMUSCULAR | Status: AC
  Administered 2019-05-26: 1 mg via INTRAVENOUS
  Filled 2019-05-26: qty 1

## 2019-05-26 MED ORDER — IOHEXOL 300 MG/ML  SOLN
100.0000 mL | Freq: Once | INTRAMUSCULAR | Status: AC | PRN
  Administered 2019-05-26: 100 mL via INTRAVENOUS

## 2019-05-26 MED ORDER — SODIUM CHLORIDE 0.9% FLUSH: 3.0000 mL | Freq: Once | INTRAVENOUS | Status: DC

## 2019-05-26 MED ORDER — PROMETHAZINE HCL 25 MG/ML IJ SOLN: 12.5000 mg | Freq: Four times a day (QID) | INTRAMUSCULAR | Status: DC | PRN

## 2019-05-26 MED ORDER — SODIUM CHLORIDE 0.9 % IV BOLUS: 1000.0000 mL | Freq: Once | INTRAVENOUS | Status: DC

## 2019-05-26 MED ORDER — ONDANSETRON HCL 4 MG/2ML IJ SOLN
4.0000 mg | Freq: Once | INTRAMUSCULAR | Status: AC
  Administered 2019-05-26: 4 mg via INTRAVENOUS
  Filled 2019-05-26: qty 2

## 2019-05-26 MED ORDER — ACETAMINOPHEN 325 MG PO TABS: 650.0000 mg | ORAL_TABLET | Freq: Four times a day (QID) | ORAL | Status: DC | PRN

## 2019-05-26 MED ORDER — ONDANSETRON HCL 4 MG PO TABS: 4.0000 mg | ORAL_TABLET | Freq: Four times a day (QID) | ORAL | Status: DC | PRN

## 2019-05-26 MED ORDER — SODIUM CHLORIDE 0.9 % IV SOLN: INTRAVENOUS | Status: DC

## 2019-05-26 MED ORDER — ONDANSETRON 4 MG PO TBDP
4.0000 mg | ORAL_TABLET | Freq: Once | ORAL | Status: AC | PRN
  Administered 2019-05-26: 4 mg via ORAL
  Filled 2019-05-26: qty 1

## 2019-05-26 MED ORDER — MORPHINE SULFATE (PF) 2 MG/ML IV SOLN
1.0000 mg | INTRAVENOUS | Status: DC | PRN
  Administered 2019-05-26 – 2019-05-27 (×3): 1 mg via INTRAVENOUS
  Filled 2019-05-26 (×3): qty 1

## 2019-05-26 MED ORDER — DICYCLOMINE HCL 10 MG PO CAPS
20.0000 mg | ORAL_CAPSULE | Freq: Once | ORAL | Status: AC
  Administered 2019-05-26: 21:00:00 20 mg via ORAL
  Filled 2019-05-26: qty 2

## 2019-05-26 MED ORDER — ONDANSETRON HCL 4 MG/2ML IJ SOLN: 4.0000 mg | Freq: Four times a day (QID) | INTRAMUSCULAR | Status: DC | PRN

## 2019-05-26 MED ORDER — ENOXAPARIN SODIUM 40 MG/0.4ML ~~LOC~~ SOLN
40.0000 mg | SUBCUTANEOUS | Status: DC
  Administered 2019-05-26 – 2019-05-27 (×2): 40 mg via SUBCUTANEOUS
  Filled 2019-05-26 (×2): qty 0.4

## 2019-05-26 MED ORDER — CLONAZEPAM 0.5 MG PO TABS
0.5000 mg | ORAL_TABLET | Freq: Two times a day (BID) | ORAL | Status: DC
  Administered 2019-05-26 – 2019-05-27 (×2): 0.5 mg via ORAL
  Filled 2019-05-26 (×2): qty 1

## 2019-05-26 MED ORDER — SODIUM CHLORIDE 0.9 % IV BOLUS
1000.0000 mL | Freq: Once | INTRAVENOUS | Status: AC
  Administered 2019-05-26: 1000 mL via INTRAVENOUS

## 2019-05-26 MED ORDER — ACETAMINOPHEN 650 MG RE SUPP: 650.0000 mg | Freq: Four times a day (QID) | RECTAL | Status: DC | PRN

## 2019-05-26 NOTE — ED Notes (Signed)
ED TO INPATIENT HANDOFF REPORT  ED Nurse Name and Phone #:  Elijah Birk RN   [phone]  S Name/Age/Gender Alison Mathews 34 y.o. female Room/Bed: ED06A/ED06A  Code Status   Code Status: Full Code  Home/SNF/Other Home Patient oriented to: self, place, time and situation Is this baseline? Yes   Triage Complete: Triage complete  Chief Complaint Enteritis [K52.9]  Triage Note Pt presents to ED via [facility] with c/o CP that is sharp in nature, abdominal pain, SOB, and emesis. Pt reports that CP started this morning, emesis that started this morning and abdominal pain that started this morning. Pt states 4-5 episodes of emesis since this morning.     Allergies Allergies  Allergen Reactions  . Motrin [Ibuprofen] Nausea And Vomiting    Level of Care/Admitting Diagnosis ED Disposition    ED Disposition Condition Comment   Admit  Hospital Area: [facility] [[id number]]  Level of Care: Med-Surg [16]  Covid Evaluation: Asymptomatic Screening Protocol (No Symptoms)  Diagnosis: Enteritis [166063]  Admitting Physician: Charlsie Quest [[id number]]  Attending Physician: Charlsie Quest [[id number]]       B Medical/Surgery History Past Medical History:  Diagnosis Date  . Anxiety   . Gall stones    History reviewed. No pertinent surgical history.   A IV Location/Drains/Wounds Patient Lines/Drains/Airways Status   Active Line/Drains/Airways    Name:   Placement date:   Placement time:   Site:   Days:   Peripheral IV 05/26/19 Right Antecubital   05/26/19    1732    Antecubital   less than 1          Intake/Output Last 24 hours  Intake/Output Summary (Last 24 hours) at 05/26/2019 2310 Last data filed at 05/26/2019 2056 Gross per 24 hour  Intake 1000 ml  Output --  Net 1000 ml    Labs/Imaging Results for orders placed or performed during the hospital encounter of 05/26/19 (from the past 48 hour(s))  Urinalysis, Complete w Microscopic     Status: Abnormal    Collection Time: 05/26/19  2:47 PM  Result Value Ref Range   Color, Urine YELLOW (A) YELLOW   APPearance CLOUDY (A) CLEAR   Specific Gravity, Urine 1.026 1.005 - 1.030   pH 5.0 5.0 - 8.0   Glucose, UA NEGATIVE NEGATIVE mg/dL   Hgb urine dipstick NEGATIVE NEGATIVE   Bilirubin Urine NEGATIVE NEGATIVE   Ketones, ur NEGATIVE NEGATIVE mg/dL   Protein, ur 220 (A) NEGATIVE mg/dL   Nitrite NEGATIVE NEGATIVE   Leukocytes,Ua LARGE (A) NEGATIVE   RBC / HPF 21-50 0 - 5 RBC/hpf   WBC, UA >50 (H) 0 - 5 WBC/hpf   Bacteria, UA RARE (A) NONE SEEN   Squamous Epithelial / LPF 6-10 0 - 5   Mucus PRESENT     Comment: Performed at [facility], [address] [place], [place] [address]  Pregnancy, urine     Status: None   Collection Time: 05/26/19  2:47 PM  Result Value Ref Range   Preg Test, Ur NEGATIVE NEGATIVE    Comment: Performed at [facility], [address] [place], [place] [address]  Lipase, blood     Status: None   Collection Time: 05/26/19  3:38 PM  Result Value Ref Range   Lipase 26 11 - 51 U/L    Comment: Performed at [facility], [address], [place], [place] [address]  Comprehensive metabolic panel     Status: Abnormal  Collection Time: 05/26/19  3:38 PM  Result Value Ref Range   Sodium 141 135 - 145 mmol/L   Potassium 4.7 3.5 - 5.1 mmol/L   Chloride 106 98 - 111 mmol/L   CO2 26 22 - 32 mmol/L   Glucose, Bld 125 (H) 70 - 99 mg/dL    Comment: Glucose reference range applies only to samples taken after fasting for at least 8 hours.   BUN 13 6 - 20 mg/dL   Creatinine, Ser 0.73 0.44 - 1.00 mg/dL   Calcium 10.0 8.9 - 10.3 mg/dL   Total Protein 8.4 (H) 6.5 - 8.1 g/dL   Albumin 4.6 3.5 - 5.0 g/dL   AST 22 15 - 41 U/L   ALT 17 0 - 44 U/L   Alkaline Phosphatase 47 38 - 126 U/L   Total Bilirubin 0.6 0.3 - 1.2 mg/dL   GFR calc non Af Amer >60 >60 mL/min   GFR calc Af Amer >60 >60 mL/min   Anion gap 9 5 - 15    Comment: Performed at [facility], [address] [place], [place] [address]  CBC     Status: Abnormal   Collection Time: 05/26/19  3:38 PM  Result Value Ref Range   WBC 17.0 (H) 4.0 - 10.5 K/uL   RBC 5.62 (H) 3.87 - 5.11 MIL/uL   Hemoglobin 15.8 (H) 12.0 - 15.0 g/dL   HCT 48.4 (H) 36.0 - 46.0 %   MCV 86.1 80.0 - 100.0 fL   MCH 28.1 26.0 - 34.0 pg   MCHC 32.6 30.0 - 36.0 g/dL   RDW 13.2 11.5 - 15.5 %   Platelets 454 (H) 150 - 400 K/uL   nRBC 0.0 0.0 - 0.2 %    Comment: Performed at [facility], [place], [place] [address]  Troponin I (High Sensitivity)     Status: None   Collection Time: 05/26/19  3:38 PM  Result Value Ref Range   Troponin I (High Sensitivity) <2 <18 ng/L    Comment: (NOTE) Elevated high sensitivity troponin I (hsTnI) values and significant  changes across serial measurements may suggest ACS but many other  chronic and acute conditions are known to elevate hsTnI results.  Refer to the "Links" section for chest pain algorithms and additional  guidance. Performed at [facility], [place], [place] [address]   Troponin I (High Sensitivity)     Status: None   Collection Time: 05/26/19  5:37 PM  Result Value Ref Range   Troponin I (High Sensitivity) 2 <18 ng/L    Comment: (NOTE) Elevated high sensitivity troponin I (hsTnI) values and significant  changes across serial measurements may suggest ACS but many other  chronic and acute conditions are known to elevate hsTnI results.  Refer to the "Links" section for chest pain algorithms and additional  guidance. Performed at [facility], [address] [place],  [address]    DG Chest 2 View  Result Date: 05/26/2019 CLINICAL DATA:  Chest pain and shortness of breath. Midsternal and epigastric pain. EXAM: CHEST - 2 VIEW COMPARISON:  Radiograph and CT 03/11/2018 FINDINGS: The cardiomediastinal contours are normal. The lungs are clear. Pulmonary vasculature is normal. No  consolidation, pleural effusion, or pneumothorax. No acute osseous abnormalities are seen. IMPRESSION: Negative radiographs of the chest. Electronically Signed   By: Keith Rake M.D.   On: 05/26/2019 16:53   CT ABDOMEN PELVIS W CONTRAST  Result Date: 05/26/2019 CLINICAL  DATA:  Nausea and vomiting EXAM: CT ABDOMEN AND PELVIS WITH CONTRAST TECHNIQUE: Multidetector CT imaging of the abdomen and pelvis was performed using the standard protocol following bolus administration of intravenous contrast. CONTRAST:  OMNIPAQUE IOHEXOL 300 MG/ML  SOLN COMPARISON:  CT dated 11/30/2017 FINDINGS: Lower chest: The lung bases are clear. The heart size is normal. Hepatobiliary: The liver is normal. Normal gallbladder.There is no biliary ductal dilation. Pancreas: Normal contours without ductal dilatation. No peripancreatic fluid collection. Spleen: No splenic laceration or hematoma. Adrenals/Urinary Tract: --Adrenal glands: No adrenal hemorrhage. --Right kidney/ureter: No hydronephrosis or perinephric hematoma. --Left kidney/ureter: No hydronephrosis or perinephric hematoma. --Urinary bladder: Unremarkable. Stomach/Bowel: --Stomach/Duodenum: No hiatal hernia or other gastric abnormality. Normal duodenal course and caliber. --Small bowel: There are mildly dilated loops small bowel scattered throughout the abdomen without evidence for distinct transition point. There is some wall thickening of several loops of the distal ileum. There is no pneumatosis or free air. No evidence for a small bowel obstruction. --Colon: No focal abnormality. --Appendix: Normal. Vascular/Lymphatic: Normal course and caliber of the major abdominal vessels. --No retroperitoneal lymphadenopathy. --No mesenteric lymphadenopathy. --No pelvic or inguinal lymphadenopathy. Reproductive: Unremarkable Other: No ascites or free air. The abdominal wall is normal. Musculoskeletal. No acute displaced fractures. IMPRESSION: Mildly dilated fluid-filled loops of  small bowel are scattered throughout the abdomen with some focal wall thickening of the distal ileum. Findings are suggestive of an infectious or inflammatory enteritis. There is no evidence for high-grade small bowel obstruction. Electronically Signed   By: Katherine Mantle M.D.   On: 05/26/2019 18:51   US Abdomen Limited RUQ  Result Date: 05/26/2019 CLINICAL DATA:  Right upper quadrant pain for 1 day. EXAM: ULTRASOUND ABDOMEN LIMITED RIGHT UPPER QUADRANT COMPARISON:  None. FINDINGS: Gallbladder: No gallstones or wall thickening visualized. No sonographic Murphy sign noted by sonographer. Common bile duct: Diameter: 3.3 mm Liver: No focal lesion identified. Patchy increased parenchymal echogenicity usually associated with hepatic steatosis. Portal vein is patent on color Doppler imaging with normal direction of blood flow towards the liver. Other: None. IMPRESSION: Hepatic steatosis. No evidence of cholelithiasis or acute cholecystitis. Electronically Signed   By: Ted Mcalpine M.D.   On: 05/26/2019 18:03    Pending Labs Unresulted Labs (From admission, onward)    Start     Ordered   05/27/19 0500  CBC  Tomorrow morning,   STAT     05/26/19 2131   05/27/19 0500  Basic metabolic panel  Tomorrow morning,   STAT     05/26/19 2131   05/26/19 2131  HIV Antibody (routine testing w rflx)  (HIV Antibody (Routine testing w reflex) panel)  Add-on,   AD     05/26/19 2131   05/26/19 2032  GI pathogen panel by PCR, stool  (Gastrointestinal Panel by PCR, Stool                                                                                                                                                     *  Does Not include CLOSTRIDIUM DIFFICILE testing.**If CDIFF testing is needed, select the C Difficile Quick Screen w PCR reflex order below)  Once,   STAT     05/26/19 2031   05/26/19 2032  C Difficile Quick Screen w PCR reflex  (C Difficile quick screen w PCR reflex panel)  Once, for 24 hours,   STAT      05/26/19 2031   05/26/19 2032  SARS CORONAVIRUS 2 (TAT 6-24 HRS) Nasopharyngeal Nasopharyngeal Swab  (Tier 3 (TAT 6-24 hrs))  Once,   STAT    Question Answer Comment  Is this test for diagnosis or screening Screening   Symptomatic for COVID-19 as defined by CDC No   Hospitalized for COVID-19 No   Admitted to ICU for COVID-19 No   Previously tested for COVID-19 No   Resident in a congregate (group) care setting No   Employed in healthcare setting No   Pregnant No      05/26/19 2031          Vitals/Pain Today's Vitals   05/26/19 1745 05/26/19 2030 05/26/19 2246 05/26/19 2300  BP:  (!) 122/58 128/82 120/74  Pulse: 95 98 92 95  Resp: 17 17 15 15   Temp:      TempSrc:      SpO2: 100% 100% 100% 100%  Weight:      Height:      PainSc:   8      Isolation Precautions Enteric precautions (UV disinfection)  Medications Medications  sodium chloride 0.9 % bolus 1,000 mL (has no administration in time range)  enoxaparin (LOVENOX) injection 40 mg (has no administration in time range)  0.9 %  sodium chloride infusion (has no administration in time range)  acetaminophen (TYLENOL) tablet 650 mg (has no administration in time range)    Or  acetaminophen (TYLENOL) suppository 650 mg (has no administration in time range)  ondansetron (ZOFRAN) tablet 4 mg (has no administration in time range)    Or  ondansetron (ZOFRAN) injection 4 mg (has no administration in time range)  promethazine (PHENERGAN) injection 12.5 mg (has no administration in time range)  morphine 2 MG/ML injection 1 mg (has no administration in time range)  clonazePAM (KLONOPIN) tablet 0.5 mg (has no administration in time range)  gabapentin (NEURONTIN) capsule 100 mg (has no administration in time range)  ondansetron (ZOFRAN-ODT) disintegrating tablet 4 mg (4 mg Oral Given 05/26/19 1457)  sodium chloride 0.9 % bolus 1,000 mL (0 mLs Intravenous Stopped 05/26/19 2056)  HYDROmorphone (DILAUDID) injection 1 mg (1 mg  Intravenous Given 05/26/19 1736)  ondansetron (ZOFRAN) injection 4 mg (4 mg Intravenous Given 05/26/19 1734)  iohexol (OMNIPAQUE) 300 MG/ML solution 100 mL (100 mLs Intravenous Contrast Given 05/26/19 1831)  dicyclomine (BENTYL) capsule 20 mg (20 mg Oral Given 05/26/19 2052)    Mobility walks Low fall risk   Focused Assessments Cardiac Assessment Handoff:    Lab Results  Component Value Date   TROPONINI <0.03 03/11/2018   No results found for: DDIMER Does the Patient currently have chest pain? No     R Recommendations: See Admitting Provider Note  Report given to:   Additional Notes:

## 2019-05-26 NOTE — ED Notes (Signed)
Dr Isaacs at bedside 

## 2019-05-26 NOTE — ED Triage Notes (Signed)
Pt presents to ED via [facility] with c/o CP that is sharp in nature, abdominal pain, SOB, and emesis. Pt reports that CP started this morning, emesis that started this morning and abdominal pain that started this morning. Pt states 4-5 episodes of emesis since this morning.

## 2019-05-26 NOTE — ED Provider Notes (Signed)
[facility] Emergency Department Provider Note  ____________________________________________   First MD Initiated Contact with Patient 05/26/19 1658     (approximate)  I have reviewed the triage vital signs and the nursing notes.   HISTORY  Chief Complaint Chest Pain, Abdominal Pain, and Emesis    HPI Alison Mathews is a 34 y.o. female with past medical history as below here with nausea, vomiting, abdominal pain.  The patient states that starting this morning, she began to develop diffuse, severe, epigastric and abdominal pain.  It is localized primarily in the epigastric and right upper quadrant area.  She said associated nausea, vomiting, diarrhea, and has been unable to eat or drink.  She said chills.  No known sick contacts.  No specific alleviating factors.  Symptoms are worse with eating.  No known sick contacts.  No known coronavirus exposures.  No suspicious food intake.       Past Medical History:  Diagnosis Date  . Anxiety   . Gall stones     Patient Active Problem List   Diagnosis Date Noted  . Sinus tachycardia 03/11/2018    History reviewed. No pertinent surgical history.  Prior to Admission medications   Medication Sig Start Date End Date Taking? Authorizing Provider  clonazePAM (KLONOPIN) 0.5 MG tablet Take 0.5 mg by mouth 2 (two) times daily.    Yes [provider]  gabapentin (NEURONTIN) 100 MG capsule Take 100 mg by mouth 3 (three) times daily. 09/19/17 05/26/19 Yes [provider]  traMADol-acetaminophen (ULTRACET) 37.5-325 MG tablet tramadol 37.5 mg-acetaminophen 325 mg tablet  TAKE 1 TABLET BY MOUTH EVERY 8 HOURS AS NEEDED FOR PAIN FOR UP TO 30 DAYS   Yes [provider]    Allergies Motrin [ibuprofen]  Family History  Problem Relation Age of Onset  . Cervical cancer Mother   . Hypertension Mother     Social History Social History   Tobacco Use  . Smoking status: Never Smoker  . Smokeless  tobacco: Never Used  Substance Use Topics  . Alcohol use: No  . Drug use: No    Review of Systems  Review of Systems  Constitutional: Positive for fatigue. Negative for fever.  HENT: Negative for congestion and sore throat.   Eyes: Negative for visual disturbance.  Respiratory: Negative for cough and shortness of breath.   Cardiovascular: Negative for chest pain.  Gastrointestinal: Positive for abdominal pain, diarrhea, nausea and vomiting.  Genitourinary: Negative for flank pain.  Musculoskeletal: Negative for back pain and neck pain.  Skin: Negative for rash and wound.  Neurological: Positive for weakness.  All other systems reviewed and are negative.    ____________________________________________  PHYSICAL EXAM:      VITAL SIGNS: ED Triage Vitals  Enc Vitals Group     BP 05/26/19 1444 93/80     Pulse Rate 05/26/19 1444 (!) 103     Resp 05/26/19 1444 20     Temp 05/26/19 1444 98.5 F (36.9 C)     Temp Source 05/26/19 1444 Oral     SpO2 05/26/19 1444 100 %     Weight 05/26/19 1445 190 lb (86.2 kg)     Height 05/26/19 1445 5\' 3"  (1.6 m)     Head Circumference --      Peak Flow --      Pain Score 05/26/19 1445 10     Pain Loc --      Pain Edu? --      Excl. in  GC? --      Physical Exam Vitals and nursing note reviewed.  Constitutional:      General: She is not in acute distress.    Appearance: She is well-developed.  HENT:     Head: Normocephalic and atraumatic.     Mouth/Throat:     Mouth: Mucous membranes are dry.  Eyes:     Conjunctiva/sclera: Conjunctivae normal.  Cardiovascular:     Rate and Rhythm: Normal rate and regular rhythm.     Heart sounds: Normal heart sounds. No murmur. No friction rub.  Pulmonary:     Effort: Pulmonary effort is normal. No respiratory distress.     Breath sounds: Normal breath sounds. No wheezing or rales.  Abdominal:     General: There is no distension.     Palpations: Abdomen is soft.     Tenderness: There is  abdominal tenderness.  Musculoskeletal:     Cervical back: Neck supple.  Skin:    General: Skin is warm.     Capillary Refill: Capillary refill takes less than 2 seconds.  Neurological:     Mental Status: She is alert and oriented to person, place, and time.     Motor: No abnormal muscle tone.       ____________________________________________   LABS (all labs ordered are listed, but only abnormal results are displayed)  Labs Reviewed  COMPREHENSIVE METABOLIC PANEL - Abnormal; Notable for the following components:      Result Value   Glucose, Bld 125 (*)    Total Protein 8.4 (*)    All other components within normal limits  CBC - Abnormal; Notable for the following components:   WBC 17.0 (*)    RBC 5.62 (*)    Hemoglobin 15.8 (*)    HCT 48.4 (*)    Platelets 454 (*)    All other components within normal limits  URINALYSIS, COMPLETE (UACMP) WITH MICROSCOPIC - Abnormal; Notable for the following components:   Color, Urine YELLOW (*)    APPearance CLOUDY (*)    Protein, ur 100 (*)    Leukocytes,Ua LARGE (*)    WBC, UA >50 (*)    Bacteria, UA RARE (*)    All other components within normal limits  LIPASE, BLOOD  PREGNANCY, URINE  TROPONIN I (HIGH SENSITIVITY)  TROPONIN I (HIGH SENSITIVITY)    ____________________________________________  EKG: Sinus tachycardia, ventricular rate 101.  PR 118, QRS 90, QTc 414.  No acute ST elevations or depressions. ________________________________________  RADIOLOGY All imaging, including plain films, CT scans, and ultrasounds, independently reviewed by me, and interpretations confirmed via formal radiology reads.  ED MD interpretation:   Chest x-ray: Clear CT abdomen/pelvis: Diffuse enterocolitis Ultrasound: Right upper quadrant: Clear, no gallstones  Official radiology report(s): DG Chest 2 View  Result Date: 05/26/2019 CLINICAL DATA:  Chest pain and shortness of breath. Midsternal and epigastric pain. EXAM: CHEST - 2 VIEW  COMPARISON:  Radiograph and CT 03/11/2018 FINDINGS: The cardiomediastinal contours are normal. The lungs are clear. Pulmonary vasculature is normal. No consolidation, pleural effusion, or pneumothorax. No acute osseous abnormalities are seen. IMPRESSION: Negative radiographs of the chest. Electronically Signed   By: Narda Rutherford M.D.   On: 05/26/2019 16:53   CT ABDOMEN PELVIS W CONTRAST  Result Date: 05/26/2019 CLINICAL DATA:  Nausea and vomiting EXAM: CT ABDOMEN AND PELVIS WITH CONTRAST TECHNIQUE: Multidetector CT imaging of the abdomen and pelvis was performed using the standard protocol following bolus administration of intravenous contrast. CONTRAST:  OMNIPAQUE IOHEXOL  300 MG/ML  SOLN COMPARISON:  CT dated 11/30/2017 FINDINGS: Lower chest: The lung bases are clear. The heart size is normal. Hepatobiliary: The liver is normal. Normal gallbladder.There is no biliary ductal dilation. Pancreas: Normal contours without ductal dilatation. No peripancreatic fluid collection. Spleen: No splenic laceration or hematoma. Adrenals/Urinary Tract: --Adrenal glands: No adrenal hemorrhage. --Right kidney/ureter: No hydronephrosis or perinephric hematoma. --Left kidney/ureter: No hydronephrosis or perinephric hematoma. --Urinary bladder: Unremarkable. Stomach/Bowel: --Stomach/Duodenum: No hiatal hernia or other gastric abnormality. Normal duodenal course and caliber. --Small bowel: There are mildly dilated loops small bowel scattered throughout the abdomen without evidence for distinct transition point. There is some wall thickening of several loops of the distal ileum. There is no pneumatosis or free air. No evidence for a small bowel obstruction. --Colon: No focal abnormality. --Appendix: Normal. Vascular/Lymphatic: Normal course and caliber of the major abdominal vessels. --No retroperitoneal lymphadenopathy. --No mesenteric lymphadenopathy. --No pelvic or inguinal lymphadenopathy. Reproductive: Unremarkable  Other: No ascites or free air. The abdominal wall is normal. Musculoskeletal. No acute displaced fractures. IMPRESSION: Mildly dilated fluid-filled loops of small bowel are scattered throughout the abdomen with some focal wall thickening of the distal ileum. Findings are suggestive of an infectious or inflammatory enteritis. There is no evidence for high-grade small bowel obstruction. Electronically Signed   By: Katherine Mantle M.D.   On: 05/26/2019 18:51   US Abdomen Limited RUQ  Result Date: 05/26/2019 CLINICAL DATA:  Right upper quadrant pain for 1 day. EXAM: ULTRASOUND ABDOMEN LIMITED RIGHT UPPER QUADRANT COMPARISON:  Alison Mathews. FINDINGS: Gallbladder: No gallstones or wall thickening visualized. No sonographic Murphy sign noted by sonographer. Common bile duct: Diameter: 3.3 mm Liver: No focal lesion identified. Patchy increased parenchymal echogenicity usually associated with hepatic steatosis. Portal vein is patent on color Doppler imaging with normal direction of blood flow towards the liver. Other: Alison Mathews. IMPRESSION: Hepatic steatosis. No evidence of cholelithiasis or acute cholecystitis. Electronically Signed   By: Ted Mcalpine M.D.   On: 05/26/2019 18:03    ____________________________________________  PROCEDURES   Procedure(s) performed (including Critical Care):  Procedures  ____________________________________________  INITIAL IMPRESSION / MDM / ASSESSMENT AND PLAN / ED COURSE  As part of my medical decision making, I reviewed the following data within the electronic MEDICAL RECORD NUMBER Nursing notes reviewed and incorporated, Old chart reviewed, Notes from prior ED visits, and [place] Controlled Substance Database       *JANISSE GHAN was evaluated in Emergency Department on 05/26/2019 for the symptoms described in the history of present illness. She was evaluated in the context of the global COVID-19 pandemic, which necessitated consideration that the patient might be at risk for  infection with the SARS-CoV-2 virus that causes COVID-19. Institutional protocols and algorithms that pertain to the evaluation of patients at risk for COVID-19 are in a state of rapid change based on information released by regulatory bodies including the CDC and federal and state organizations. These policies and algorithms were followed during the patient's care in the ED.  Some ED evaluations and interventions may be delayed as a result of limited staffing during the pandemic.*     Medical Decision Making: 34 year old female here with diffuse abdominal pain, nausea, vomiting.  She has a significant leukocytosis and CT scan is consistent with likely diffuse enterocolitis.  She has ongoing, fairly significant nausea and pain in the ED despite multiple doses of analgesics and IV fluids.  She was p.o. challenged with worsening pain and does not feel comfortable with going home.  Given the degree of enterocolitis on her scan with vomiting here despite fluids and antiemetics, will admit for observation.  GI panel sent.  ____________________________________________  FINAL CLINICAL IMPRESSION(S) / ED DIAGNOSES  Final diagnoses:  RUQ pain  Enterocolitis     MEDICATIONS GIVEN DURING THIS VISIT:  Medications  sodium chloride flush (NS) 0.9 % injection 3 mL (has no administration in time range)  dicyclomine (BENTYL) capsule 20 mg (has no administration in time range)  sodium chloride 0.9 % bolus 1,000 mL (has no administration in time range)  ondansetron (ZOFRAN-ODT) disintegrating tablet 4 mg (4 mg Oral Given 05/26/19 1457)  sodium chloride 0.9 % bolus 1,000 mL (1,000 mLs Intravenous New Bag/Given 05/26/19 1733)  HYDROmorphone (DILAUDID) injection 1 mg (1 mg Intravenous Given 05/26/19 1736)  ondansetron (ZOFRAN) injection 4 mg (4 mg Intravenous Given 05/26/19 1734)  iohexol (OMNIPAQUE) 300 MG/ML solution 100 mL (100 mLs Intravenous Contrast Given 05/26/19 1831)     ED Discharge Orders    Alison Mathews        Note:  This document was prepared using Dragon voice recognition software and may include unintentional dictation errors.   Duffy Bruce, MD 05/26/19 2031

## 2019-05-26 NOTE — ED Notes (Signed)
Lab called to come stick patient at this time.

## 2019-05-26 NOTE — ED Notes (Signed)
First Nurse Note: Pt to ED via [facility] from home for vomiting. Pt has not been able to keep anything down. Pt has vomited x 4. Pt is in NAD.

## 2019-05-26 NOTE — ED Notes (Signed)
Pt transported to CT ?

## 2019-05-26 NOTE — ED Notes (Signed)
US at bedside

## 2019-05-26 NOTE — H&P (Signed)
History and Physical    Alison Mathews:[id number] DOB: Jun 20, 1985 DOA: 05/26/2019  PCP: Tessie Fass, MD  Patient coming from: Home  I have personally briefly reviewed patient's old medical records in [facility]  Chief Complaint: Nausea, vomiting, abdominal pain  HPI: Alison Mathews is a 34 y.o. female with medical history significant for generalized anxiety disorder who presents to the ED for evaluation of nausea, vomiting, and abdominal pain.  Patient states she was in her usual state of health until around 8 AM on 05/26/2019 when she developed acute onset of significant right-sided abdominal pain.  Pain had some radiation to her right flank.  She had associated nausea with vomiting.  She tried drinking soup in the morning but soon after had more emesis.  She has not seen any blood in her vomitus.  She denies any diarrhea and reports a normal-appearing bowel movement this morning.  She had a subjective fever and chills.  She has some shortness of breath which resolved prior to her ED arrival.  She denies any chest pain, lightheadedness/dizziness, dysuria, or rash.  She denies any recent change in diet.  She denies any sick contacts or travel.  She denies any tobacco, alcohol, or illicit drug use.  She has not had any recent changes in her medications.  ED Course:  Initial vitals showed BP 93/80, pulse 103, RR 20, temp 98.5 Fahrenheit, SPO2 100% on room air.  Labs are notable for sodium 141, potassium 4.7, bicarb 26, BUN 13, creatinine 0.73, LFTs within normal limits, lipase 26, WBC 17.0, hemoglobin 15.8, platelets 444,000, high-sensitivity troponin I 2.  Urinalysis shows negative nitrates, large leukocytes, 21-50 RBC/hpf, >50 WBC/hpf, rare bacteria microscopy. Urine pregnancy test is negative. C. difficile and GI pathogen panels were ordered and pending. SARS-CoV-2 PCR is ordered and pending.  2 view chest x-ray is negative for focal consolidation, edema, or effusion.  RUQ  ultrasound shows hepatic steatosis without evidence of cholelithiasis, acute cholecystitis. CBD diameter is 3.3 mm.  CT abdomen/pelvis with contrast shows mildly dilated fluid-filled loops of small bowel scattered throughout the abdomen with some focal wall thickening of the distal ileum suggestive of an infectious or inflammatory enteritis. No evidence for high-grade small bowel obstruction seen.  Patient was given 2 L normal saline, Zofran, Dilaudid, and Bentyl. Oral challenge was attempted and failed therefore the hospitalist service was consulted to admit for further evaluation management.  Review of Systems: All systems reviewed and are negative except as documented in history of present illness above.   Past Medical History:  Diagnosis Date  . Anxiety   . Gall stones     History reviewed. No pertinent surgical history.  Social History:  reports that she has never smoked. She has never used smokeless tobacco. She reports that she does not drink alcohol or use drugs.  Allergies  Allergen Reactions  . Motrin [Ibuprofen] Nausea And Vomiting    Family History  Problem Relation Age of Onset  . Cervical cancer Mother   . Hypertension Mother      Prior to Admission medications   Medication Sig Start Date End Date Taking? Authorizing Provider  clonazePAM (KLONOPIN) 0.5 MG tablet Take 0.5 mg by mouth 2 (two) times daily.    Yes [provider]  gabapentin (NEURONTIN) 100 MG capsule Take 100 mg by mouth 3 (three) times daily. 09/19/17 05/26/19 Yes [provider]  traMADol-acetaminophen (ULTRACET) 37.5-325 MG tablet tramadol 37.5 mg-acetaminophen 325 mg tablet  TAKE 1 TABLET BY  MOUTH EVERY 8 HOURS AS NEEDED FOR PAIN FOR UP TO 30 DAYS   Yes [provider]    Physical Exam: Vitals:   05/26/19 1445 05/26/19 1700 05/26/19 1730 05/26/19 1745  BP:  120/77 121/78   Pulse:  96 (!) 106 95  Resp:  11 17 17   Temp:      TempSrc:      SpO2:  100% 100% 100%    Weight: 86.2 kg     Height: 5\' 3"  (1.6 m)      Constitutional: Obese woman resting in bed with head elevated, NAD, calm, comfortable Eyes: PERRL, lids and conjunctivae normal ENMT: Mucous membranes are dry. Posterior pharynx clear of any exudate or lesions. Neck: normal, supple, no masses. Respiratory: clear to auscultation bilaterally, no wheezing, no crackles. Normal respiratory effort. No accessory muscle use.  Cardiovascular: Regular rate and rhythm, no murmurs / rubs / gallops. No extremity edema. 2+ pedal pulses. Abdomen: Mild right-sided abdominal tenderness, no masses palpated. No hepatosplenomegaly. Bowel sounds positive.  Musculoskeletal: no clubbing / cyanosis. No joint deformity upper and lower extremities. Good ROM, no contractures. Normal muscle tone.  Skin: no rashes, lesions, ulcers. No induration Neurologic: CN 2-12 grossly intact. Sensation intact, Strength 5/5 in all 4.  Psychiatric: Normal judgment and insight. Alert and oriented x 3. Normal mood.   Labs on Admission: I have personally reviewed following labs and imaging studies  CBC: Recent Labs  Lab 05/26/19 1538  WBC 17.0*  HGB 15.8*  HCT 48.4*  MCV 86.1  PLT 454*   Basic Metabolic Panel: Recent Labs  Lab 05/26/19 1538  NA 141  K 4.7  CL 106  CO2 26  GLUCOSE 125*  BUN 13  CREATININE 0.73  CALCIUM 10.0   GFR: Estimated Creatinine Clearance: 105 mL/min (by C-G formula based on SCr of 0.73 mg/dL). Liver Function Tests: Recent Labs  Lab 05/26/19 1538  AST 22  ALT 17  ALKPHOS 47  BILITOT 0.6  PROT 8.4*  ALBUMIN 4.6   Recent Labs  Lab 05/26/19 1538  LIPASE 26   No results for input(s): AMMONIA in the last 168 hours. Coagulation Profile: No results for input(s): INR, PROTIME in the last 168 hours. Cardiac Enzymes: No results for input(s): CKTOTAL, CKMB, CKMBINDEX, TROPONINI in the last 168 hours. BNP (last 3 results) No results for input(s): PROBNP in the last 8760 hours. HbA1C: No  results for input(s): HGBA1C in the last 72 hours. CBG: No results for input(s): GLUCAP in the last 168 hours. Lipid Profile: No results for input(s): CHOL, HDL, LDLCALC, TRIG, CHOLHDL, LDLDIRECT in the last 72 hours. Thyroid Function Tests: No results for input(s): TSH, T4TOTAL, FREET4, T3FREE, THYROIDAB in the last 72 hours. Anemia Panel: No results for input(s): VITAMINB12, FOLATE, FERRITIN, TIBC, IRON, RETICCTPCT in the last 72 hours. Urine analysis:    Component Value Date/Time   COLORURINE YELLOW (A) 05/26/2019 1447   APPEARANCEUR CLOUDY (A) 05/26/2019 1447   APPEARANCEUR Clear 06/17/2014 0907   LABSPEC 1.026 05/26/2019 1447   LABSPEC 1.021 06/17/2014 0907   PHURINE 5.0 05/26/2019 1447   GLUCOSEU NEGATIVE 05/26/2019 1447   GLUCOSEU Negative 06/17/2014 0907   HGBUR NEGATIVE 05/26/2019 1447   BILIRUBINUR NEGATIVE 05/26/2019 1447   BILIRUBINUR Negative 06/17/2014 0907   KETONESUR NEGATIVE 05/26/2019 1447   PROTEINUR 100 (A) 05/26/2019 1447   NITRITE NEGATIVE 05/26/2019 1447   LEUKOCYTESUR LARGE (A) 05/26/2019 1447   LEUKOCYTESUR Negative 06/17/2014 0907    Radiological Exams on Admission: DG Chest  2 View  Result Date: 05/26/2019 CLINICAL DATA:  Chest pain and shortness of breath. Midsternal and epigastric pain. EXAM: CHEST - 2 VIEW COMPARISON:  Radiograph and CT 03/11/2018 FINDINGS: The cardiomediastinal contours are normal. The lungs are clear. Pulmonary vasculature is normal. No consolidation, pleural effusion, or pneumothorax. No acute osseous abnormalities are seen. IMPRESSION: Negative radiographs of the chest. Electronically Signed   By: Narda Rutherford M.D.   On: 05/26/2019 16:53   CT ABDOMEN PELVIS W CONTRAST  Result Date: 05/26/2019 CLINICAL DATA:  Nausea and vomiting EXAM: CT ABDOMEN AND PELVIS WITH CONTRAST TECHNIQUE: Multidetector CT imaging of the abdomen and pelvis was performed using the standard protocol following bolus administration of intravenous contrast.  CONTRAST:  OMNIPAQUE IOHEXOL 300 MG/ML  SOLN COMPARISON:  CT dated 11/30/2017 FINDINGS: Lower chest: The lung bases are clear. The heart size is normal. Hepatobiliary: The liver is normal. Normal gallbladder.There is no biliary ductal dilation. Pancreas: Normal contours without ductal dilatation. No peripancreatic fluid collection. Spleen: No splenic laceration or hematoma. Adrenals/Urinary Tract: --Adrenal glands: No adrenal hemorrhage. --Right kidney/ureter: No hydronephrosis or perinephric hematoma. --Left kidney/ureter: No hydronephrosis or perinephric hematoma. --Urinary bladder: Unremarkable. Stomach/Bowel: --Stomach/Duodenum: No hiatal hernia or other gastric abnormality. Normal duodenal course and caliber. --Small bowel: There are mildly dilated loops small bowel scattered throughout the abdomen without evidence for distinct transition point. There is some wall thickening of several loops of the distal ileum. There is no pneumatosis or free air. No evidence for a small bowel obstruction. --Colon: No focal abnormality. --Appendix: Normal. Vascular/Lymphatic: Normal course and caliber of the major abdominal vessels. --No retroperitoneal lymphadenopathy. --No mesenteric lymphadenopathy. --No pelvic or inguinal lymphadenopathy. Reproductive: Unremarkable Other: No ascites or free air. The abdominal wall is normal. Musculoskeletal. No acute displaced fractures. IMPRESSION: Mildly dilated fluid-filled loops of small bowel are scattered throughout the abdomen with some focal wall thickening of the distal ileum. Findings are suggestive of an infectious or inflammatory enteritis. There is no evidence for high-grade small bowel obstruction. Electronically Signed   By: Katherine Mantle M.D.   On: 05/26/2019 18:51   US Abdomen Limited RUQ  Result Date: 05/26/2019 CLINICAL DATA:  Right upper quadrant pain for 1 day. EXAM: ULTRASOUND ABDOMEN LIMITED RIGHT UPPER QUADRANT COMPARISON:  None. FINDINGS: Gallbladder:  No gallstones or wall thickening visualized. No sonographic Murphy sign noted by sonographer. Common bile duct: Diameter: 3.3 mm Liver: No focal lesion identified. Patchy increased parenchymal echogenicity usually associated with hepatic steatosis. Portal vein is patent on color Doppler imaging with normal direction of blood flow towards the liver. Other: None. IMPRESSION: Hepatic steatosis. No evidence of cholelithiasis or acute cholecystitis. Electronically Signed   By: Ted Mcalpine M.D.   On: 05/26/2019 18:03    EKG: Independently reviewed. Sinus tachycardia without acute ischemic changes. Not significantly changed when compared to prior.  Assessment/Plan Principal Problem:   Gastroenteritis Active Problems:   Anxiety  Alison Mathews is a 34 y.o. female with medical history significant for generalized anxiety disorder who is admitted with gastroenteritis.   Gastroenteritis: Patient with persistent nausea and vomiting and inability to maintain adequate oral intake.  Suspect viral gastroenteritis.  White count is elevated but likely some element of hemoconcentration.  Will continue supportive care. -Continue IV fluid hydration overnight -Antiemetics and pain control as needed -Follow C. difficile and GI pathogen panel -Keep n.p.o. except for sips with meds for now, advance diet as tolerated  Anxiety: Resume home Klonopin.  DVT prophylaxis: Lovenox Code Status: Full  code, confirmed with patient Family Communication: Discussed with patient, she has discussed with family Disposition Plan: From home, likely discharge to home when improving symptomatically and able to maintain adequate oral intake Consults called: None Admission status: Observation   Zada Finders MD [facility]  If 7PM-7AM, please contact night-coverage [email or web address]  05/26/2019, 8:57 PM

## 2019-05-27 DIAGNOSIS — K529 Noninfective gastroenteritis and colitis, unspecified: Secondary | ICD-10-CM

## 2019-05-27 LAB — BASIC METABOLIC PANEL
Anion gap: 6 (ref 5–15)
BUN: 10 mg/dL (ref 6–20)
CO2: 24 mmol/L (ref 22–32)
Calcium: 9.1 mg/dL (ref 8.9–10.3)
Chloride: 111 mmol/L (ref 98–111)
Creatinine, Ser: 0.75 mg/dL (ref 0.44–1.00)
GFR calc Af Amer: >60 mL/min (ref >60)
GFR calc non Af Amer: >60 mL/min (ref >60)
Glucose, Bld: 94 mg/dL (ref 70–99)
Potassium: 3.7 mmol/L (ref 3.5–5.1)
Sodium: 141 mmol/L (ref 135–145)

## 2019-05-27 LAB — CBC
HCT: 40 % (ref 36.0–46.0)
Hemoglobin: 12.8 g/dL (ref 12.0–15.0)
MCH: 27.3 pg (ref 26.0–34.0)
MCHC: 32 g/dL (ref 30.0–36.0)
MCV: 85.3 fL (ref 80.0–100.0)
Platelets: 439 10*3/uL — ABNORMAL HIGH (ref 150–400)
RBC: 4.69 MIL/uL (ref 3.87–5.11)
RDW: 13.2 % (ref 11.5–15.5)
WBC: 18.3 10*3/uL — ABNORMAL HIGH (ref 4.0–10.5)
nRBC: 0 % (ref 0.0–0.2)

## 2019-05-27 LAB — HIV ANTIBODY (ROUTINE TESTING W REFLEX): HIV Screen 4th Generation wRfx: NONREACTIVE

## 2019-05-27 LAB — SARS CORONAVIRUS 2 (TAT 6-24 HRS): SARS Coronavirus 2: NEGATIVE

## 2019-05-27 MED ORDER — TRAMADOL HCL 50 MG PO TABS
50.0000 mg | ORAL_TABLET | Freq: Four times a day (QID) | ORAL | Status: DC | PRN
  Administered 2019-05-27 – 2019-05-28 (×3): 50 mg via ORAL
  Filled 2019-05-27 (×3): qty 1

## 2019-05-27 MED ORDER — MORPHINE SULFATE (PF) 2 MG/ML IV SOLN
2.0000 mg | Freq: Once | INTRAVENOUS | Status: AC
  Administered 2019-05-27: 2 mg via INTRAVENOUS
  Filled 2019-05-27: qty 1

## 2019-05-27 MED ORDER — CLONAZEPAM 0.5 MG PO TABS
0.5000 mg | ORAL_TABLET | Freq: Two times a day (BID) | ORAL | Status: DC | PRN
  Administered 2019-05-27 – 2019-05-28 (×2): 0.5 mg via ORAL
  Filled 2019-05-27 (×2): qty 1

## 2019-05-27 MED ORDER — ACETAMINOPHEN 500 MG PO TABS: 1000.0000 mg | ORAL_TABLET | Freq: Three times a day (TID) | ORAL | Status: DC | PRN

## 2019-05-27 NOTE — Progress Notes (Addendum)
PROGRESS NOTE    Alison Mathews  PNT:[id number] DOB: 1985/08/09 DOA: 05/26/2019 PCP: Tessie Fass, MD    Assessment & Plan:   Principal Problem:   Gastroenteritis Active Problems:   Anxiety   Alison Mathews is a 34 y.o. female with medical history significant for generalized anxiety disorder who presents to the ED for evaluation of nausea, vomiting, and abdominal pain.  Patient states she was in her usual state of health until around 8 AM on 05/26/2019 when she developed acute onset of significant right-sided abdominal pain.  Pain had some radiation to her right flank.  She had associated nausea with vomiting.    Gastroenteritis Patient with persistent nausea and vomiting and inability to maintain adequate oral intake.  CT a/p showed "Mildly dilated fluid-filled loops of small bowel are scattered throughout the abdomen with some focal wall thickening of the distal Ileum."  Suspect viral gastroenteritis.  White count is elevated to 17. --s/p IV hydration PLAN: --supportive care. -Antiemetics and pain control with tylenol and tramadol PRN --Clear liquid for now  Anxiety: Continue home Klonopin PRN   DVT prophylaxis: Lovenox SQ Code Status: Full code  Family Communication:  Disposition Plan: Home tomorrow if tolerating solids   Subjective and Interval History:  Pt reported pain in her right abdomen when she ate solids, but tolerated liquid fine.  1 BM, not watery.  No fever, dyspnea, chest pain, dysuria.   Objective: Vitals:   05/26/19 2300 05/26/19 2334 05/27/19 0510 05/27/19 1215  BP: 120/74 122/82 106/70 107/63  Pulse: 95 97 92 98  Resp: 15 20 20 18   Temp:  98.1 F (36.7 C) (!) 97.4 F (36.3 C) 97.9 F (36.6 C)  TempSrc:  Oral Oral Oral  SpO2: 100% 100% 100% 100%  Weight:  87.8 kg    Height:  5\' 3"  (1.6 m)      Intake/Output Summary (Last 24 hours) at 05/27/2019 1535 Last data filed at 05/27/2019 0611 Gross per 24 hour  Intake 1642.81 ml  Output 0 ml    Net 1642.81 ml   Filed Weights   05/26/19 1445 05/26/19 2334  Weight: 86.2 kg 87.8 kg    Examination:   Constitutional: NAD, AAOx3 HEENT: conjunctivae and lids normal, EOMI CV: RRR no M,R,G. Distal pulses +2.  No cyanosis.   RESP: CTA B/L, normal respiratory effort  GI: +BS, NTND Extremities: No effusions, edema, or tenderness in BLE SKIN: warm, dry and intact Neuro: II - XII grossly intact.  Sensation intact Psych: Normal mood and affect.  Appropriate judgement and reason   Data Reviewed: I have personally reviewed following labs and imaging studies  CBC: Recent Labs  Lab 05/26/19 1538 05/27/19 0404  WBC 17.0* 18.3*  HGB 15.8* 12.8  HCT 48.4* 40.0  MCV 86.1 85.3  PLT 454* 439*   Basic Metabolic Panel: Recent Labs  Lab 05/26/19 1538 05/27/19 0404  NA 141 141  K 4.7 3.7  CL 106 111  CO2 26 24  GLUCOSE 125* 94  BUN 13 10  CREATININE 0.73 0.75  CALCIUM 10.0 9.1   GFR: Estimated Creatinine Clearance: 106.1 mL/min (by C-G formula based on SCr of 0.75 mg/dL). Liver Function Tests: Recent Labs  Lab 05/26/19 1538  AST 22  ALT 17  ALKPHOS 47  BILITOT 0.6  PROT 8.4*  ALBUMIN 4.6   Recent Labs  Lab 05/26/19 1538  LIPASE 26   No results for input(s): AMMONIA in the last 168 hours. Coagulation Profile: No results for  input(s): INR, PROTIME in the last 168 hours. Cardiac Enzymes: No results for input(s): CKTOTAL, CKMB, CKMBINDEX, TROPONINI in the last 168 hours. BNP (last 3 results) No results for input(s): PROBNP in the last 8760 hours. HbA1C: No results for input(s): HGBA1C in the last 72 hours. CBG: No results for input(s): GLUCAP in the last 168 hours. Lipid Profile: No results for input(s): CHOL, HDL, LDLCALC, TRIG, CHOLHDL, LDLDIRECT in the last 72 hours. Thyroid Function Tests: No results for input(s): TSH, T4TOTAL, FREET4, T3FREE, THYROIDAB in the last 72 hours. Anemia Panel: No results for input(s): VITAMINB12, FOLATE, FERRITIN, TIBC,  IRON, RETICCTPCT in the last 72 hours. Sepsis Labs: No results for input(s): PROCALCITON, LATICACIDVEN in the last 168 hours.  Recent Results (from the past 240 hour(s))  SARS CORONAVIRUS 2 (TAT 6-24 HRS) Nasopharyngeal Nasopharyngeal Swab     Status: None   Collection Time: 05/26/19  9:04 PM   Specimen: Nasopharyngeal Swab  Result Value Ref Range Status   SARS Coronavirus 2 NEGATIVE NEGATIVE Final    Comment: (NOTE) SARS-CoV-2 target nucleic acids are NOT DETECTED. The SARS-CoV-2 RNA is generally detectable in upper and lower respiratory specimens during the acute phase of infection. Negative results do not preclude SARS-CoV-2 infection, do not rule out co-infections with other pathogens, and should not be used as the sole basis for treatment or other patient management decisions. Negative results must be combined with clinical observations, patient history, and epidemiological information. The expected result is Negative. Fact Sheet for Patients: HairSlick.no Fact Sheet for Healthcare Providers: quierodirigir.com This test is not yet approved or cleared by the [place] FDA and  has been authorized for detection and/or diagnosis of SARS-CoV-2 by FDA under an Emergency Use Authorization (EUA). This EUA will remain  in effect (meaning this test can be used) for the duration of the COVID-19 declaration under Section 56 4(b)(1) of the Act, 21 U.S.C. section 360bbb-3(b)(1), unless the authorization is terminated or revoked sooner. Performed at [facility], [address] [place], [place] [address]       Radiology Studies: DG Chest 2 View  Result Date: 05/26/2019 CLINICAL DATA:  Chest pain and shortness of breath. Midsternal and epigastric pain. EXAM: CHEST - 2 VIEW COMPARISON:  Radiograph and CT 03/11/2018 FINDINGS: The cardiomediastinal contours are normal. The lungs are clear. Pulmonary vasculature is normal. No  consolidation, pleural effusion, or pneumothorax. No acute osseous abnormalities are seen. IMPRESSION: Negative radiographs of the chest. Electronically Signed   By: Narda Rutherford M.D.   On: 05/26/2019 16:53   CT ABDOMEN PELVIS W CONTRAST  Result Date: 05/26/2019 CLINICAL DATA:  Nausea and vomiting EXAM: CT ABDOMEN AND PELVIS WITH CONTRAST TECHNIQUE: Multidetector CT imaging of the abdomen and pelvis was performed using the standard protocol following bolus administration of intravenous contrast. CONTRAST:  OMNIPAQUE IOHEXOL 300 MG/ML  SOLN COMPARISON:  CT dated 11/30/2017 FINDINGS: Lower chest: The lung bases are clear. The heart size is normal. Hepatobiliary: The liver is normal. Normal gallbladder.There is no biliary ductal dilation. Pancreas: Normal contours without ductal dilatation. No peripancreatic fluid collection. Spleen: No splenic laceration or hematoma. Adrenals/Urinary Tract: --Adrenal glands: No adrenal hemorrhage. --Right kidney/ureter: No hydronephrosis or perinephric hematoma. --Left kidney/ureter: No hydronephrosis or perinephric hematoma. --Urinary bladder: Unremarkable. Stomach/Bowel: --Stomach/Duodenum: No hiatal hernia or other gastric abnormality. Normal duodenal course and caliber. --Small bowel: There are mildly dilated loops small bowel scattered throughout the abdomen without evidence for distinct transition point. There is some wall thickening of several  loops of the distal ileum. There is no pneumatosis or free air. No evidence for a small bowel obstruction. --Colon: No focal abnormality. --Appendix: Normal. Vascular/Lymphatic: Normal course and caliber of the major abdominal vessels. --No retroperitoneal lymphadenopathy. --No mesenteric lymphadenopathy. --No pelvic or inguinal lymphadenopathy. Reproductive: Unremarkable Other: No ascites or free air. The abdominal wall is normal. Musculoskeletal. No acute displaced fractures. IMPRESSION: Mildly dilated fluid-filled loops of  small bowel are scattered throughout the abdomen with some focal wall thickening of the distal ileum. Findings are suggestive of an infectious or inflammatory enteritis. There is no evidence for high-grade small bowel obstruction. Electronically Signed   By: Constance Holster M.D.   On: 05/26/2019 18:51   US Abdomen Limited RUQ  Result Date: 05/26/2019 CLINICAL DATA:  Right upper quadrant pain for 1 day. EXAM: ULTRASOUND ABDOMEN LIMITED RIGHT UPPER QUADRANT COMPARISON:  None. FINDINGS: Gallbladder: No gallstones or wall thickening visualized. No sonographic Murphy sign noted by sonographer. Common bile duct: Diameter: 3.3 mm Liver: No focal lesion identified. Patchy increased parenchymal echogenicity usually associated with hepatic steatosis. Portal vein is patent on color Doppler imaging with normal direction of blood flow towards the liver. Other: None. IMPRESSION: Hepatic steatosis. No evidence of cholelithiasis or acute cholecystitis. Electronically Signed   By: Fidela Salisbury M.D.   On: 05/26/2019 18:03     Scheduled Meds: . enoxaparin (LOVENOX) injection  40 mg Subcutaneous Q24H  . gabapentin  100 mg Oral TID   Continuous Infusions: . sodium chloride       LOS: 0 days     Enzo Bi, MD [facility] If 7PM-7AM, please contact night-coverage 05/27/2019, 3:35 PM

## 2019-05-27 NOTE — Plan of Care (Signed)
Patient's pain has improved from admission but still has pain after eating.  MD decreased diet back to clears and patient tolerating it well.  No significant changes.

## 2019-05-28 LAB — CBC
HCT: 36.1 % (ref 36.0–46.0)
Hemoglobin: 11.8 g/dL — ABNORMAL LOW (ref 12.0–15.0)
MCH: 27.6 pg (ref 26.0–34.0)
MCHC: 32.7 g/dL (ref 30.0–36.0)
MCV: 84.5 fL (ref 80.0–100.0)
Platelets: 374 10*3/uL (ref 150–400)
RBC: 4.27 MIL/uL (ref 3.87–5.11)
RDW: 13.4 % (ref 11.5–15.5)
WBC: 10.6 10*3/uL — ABNORMAL HIGH (ref 4.0–10.5)
nRBC: 0 % (ref 0.0–0.2)

## 2019-05-28 LAB — BASIC METABOLIC PANEL
Anion gap: 7 (ref 5–15)
BUN: 7 mg/dL (ref 6–20)
CO2: 26 mmol/L (ref 22–32)
Calcium: 8.8 mg/dL — ABNORMAL LOW (ref 8.9–10.3)
Chloride: 105 mmol/L (ref 98–111)
Creatinine, Ser: 0.78 mg/dL (ref 0.44–1.00)
GFR calc Af Amer: >60 mL/min (ref >60)
GFR calc non Af Amer: >60 mL/min (ref >60)
Glucose, Bld: 84 mg/dL (ref 70–99)
Potassium: 3.5 mmol/L (ref 3.5–5.1)
Sodium: 138 mmol/L (ref 135–145)

## 2019-05-28 LAB — POCT PREGNANCY, URINE: Preg Test, Ur: NEGATIVE

## 2019-05-28 LAB — MAGNESIUM: Magnesium: 2 mg/dL (ref 1.7–2.4)

## 2019-05-28 NOTE — Progress Notes (Signed)
   05/28/19 1300  Clinical Encounter Type  Visited With Patient  Visit Type Initial  Referral From Chaplain  Consult/Referral To Chaplain  Chaplain stopped in to see patient but she was in the bathroom. Patient came to the door and Chaplain said she was just checking on her. Patient said thank you. Patient also said she is feeling much better than yesterday.

## 2019-05-28 NOTE — Plan of Care (Signed)
The patient has been discharged. IV has been removed. No falls. Education has been completed. Problem: Education: Goal: Knowledge of General Education information will improve Description: Including pain rating scale, medication(s)/side effects and non-pharmacologic comfort measures Outcome: Completed/Met   Problem: Health Behavior/Discharge Planning: Goal: Ability to manage health-related needs will improve Outcome: Completed/Met   Problem: Clinical Measurements: Goal: Ability to maintain clinical measurements within normal limits will improve Outcome: Completed/Met Goal: Will remain free from infection Outcome: Completed/Met Goal: Diagnostic test results will improve Outcome: Completed/Met Goal: Respiratory complications will improve Outcome: Completed/Met Goal: Cardiovascular complication will be avoided Outcome: Completed/Met   Problem: Activity: Goal: Risk for activity intolerance will decrease Outcome: Completed/Met   Problem: Nutrition: Goal: Adequate nutrition will be maintained Outcome: Completed/Met   Problem: Coping: Goal: Level of anxiety will decrease Outcome: Completed/Met   Problem: Elimination: Goal: Will not experience complications related to bowel motility Outcome: Completed/Met Goal: Will not experience complications related to urinary retention Outcome: Completed/Met   Problem: Pain Managment: Goal: General experience of comfort will improve Outcome: Completed/Met   Problem: Safety: Goal: Ability to remain free from injury will improve Outcome: Completed/Met   Problem: Skin Integrity: Goal: Risk for impaired skin integrity will decrease Outcome: Completed/Met

## 2019-05-28 NOTE — Discharge Summary (Signed)
Physician Discharge Summary   Alison Mathews  female DOB: [birth year]  VVO:[id number]  PCP: Tessie Fass, MD  Admit date: 05/26/2019 Discharge date: 05/28/2019  Admitted From: home Disposition:  home CODE STATUS: Full code  Discharge Instructions    Diet - low sodium heart healthy   Complete by: As directed    Discharge instructions   Complete by: As directed    You most likely has a GI illness (GI bug) with some mild inflammation of your small intestine.  Slowly advance your food intake, and keep hydrated.   Dr. Darlin Priestly - -   Increase activity slowly   Complete by: As directed        Hospital Course:  For full details, please see H&P, progress notes, consult notes and ancillary notes.  Briefly,  Alison D Clappis a 34 y.o.femalewith medical history significant forgeneralized anxiety disorder who presented to the ED for evaluation of nausea, vomiting, and abdominal pain.Patient stated she was in her usual state of health until around 8 AM on 05/26/2019 when she developed acute onset of significant right-sided abdominal pain. Pain had some radiation to her right flank. She had associated nausea with vomiting.    Gastroenteritis Patient with persistent nausea and vomiting and inability to maintain adequate oral intake.  CT a/p showed "Mildly dilated fluid-filled loops of small bowel are scattered throughout the abdomen with some focal wall thickening of the distal Ileum." Suspect viral gastroenteritis. White count is elevated to 17.  Pt received IV hydration, Antiemetics and pain control with tylenol and tramadol PRN.  Pt had to be kept on clear liquid diet due to pain with consuming anything heavier.  Symptoms and leukocytosis improved prior to discharge, and pt was able to advance her diet.  Anxiety: Continued home Klonopin PRN   Discharge Diagnoses:  Principal Problem:   Gastroenteritis Active Problems:   Anxiety    Discharge  Instructions:  Allergies as of 05/28/2019      Reactions   Motrin [ibuprofen] Nausea And Vomiting      Medication List    STOP taking these medications   traMADol-acetaminophen 37.5-325 MG tablet Commonly known as: ULTRACET     TAKE these medications   clonazePAM 0.5 MG tablet Commonly known as: KLONOPIN Take 0.5 mg by mouth 2 (two) times daily.   gabapentin 100 MG capsule Commonly known as: NEURONTIN Take 100 mg by mouth 3 (three) times daily.       Follow-up Information    Cooner, Ruben Gottron, MD. Schedule an appointment as soon as possible for a visit in 1 week(s).   Specialty: Internal Medicine Contact information: [address] [place] [address] [phone]           Allergies  Allergen Reactions  . Motrin [Ibuprofen] Nausea And Vomiting     The results of significant diagnostics from this hospitalization (including imaging, microbiology, ancillary and laboratory) are listed below for reference.   Consultations:   Procedures/Studies: DG Chest 2 View  Result Date: 05/26/2019 CLINICAL DATA:  Chest pain and shortness of breath. Midsternal and epigastric pain. EXAM: CHEST - 2 VIEW COMPARISON:  Radiograph and CT 03/11/2018 FINDINGS: The cardiomediastinal contours are normal. The lungs are clear. Pulmonary vasculature is normal. No consolidation, pleural effusion, or pneumothorax. No acute osseous abnormalities are seen. IMPRESSION: Negative radiographs of the chest. Electronically Signed   By: Narda Rutherford M.D.   On: 05/26/2019 16:53   CT ABDOMEN PELVIS W CONTRAST  Result Date:  05/26/2019 CLINICAL DATA:  Nausea and vomiting EXAM: CT ABDOMEN AND PELVIS WITH CONTRAST TECHNIQUE: Multidetector CT imaging of the abdomen and pelvis was performed using the standard protocol following bolus administration of intravenous contrast. CONTRAST:  126mL OMNIPAQUE IOHEXOL 300 MG/ML  SOLN COMPARISON:  CT dated 11/30/2017 FINDINGS: Lower chest: The lung  bases are clear. The heart size is normal. Hepatobiliary: The liver is normal. Normal gallbladder.There is no biliary ductal dilation. Pancreas: Normal contours without ductal dilatation. No peripancreatic fluid collection. Spleen: No splenic laceration or hematoma. Adrenals/Urinary Tract: --Adrenal glands: No adrenal hemorrhage. --Right kidney/ureter: No hydronephrosis or perinephric hematoma. --Left kidney/ureter: No hydronephrosis or perinephric hematoma. --Urinary bladder: Unremarkable. Stomach/Bowel: --Stomach/Duodenum: No hiatal hernia or other gastric abnormality. Normal duodenal course and caliber. --Small bowel: There are mildly dilated loops small bowel scattered throughout the abdomen without evidence for distinct transition point. There is some wall thickening of several loops of the distal ileum. There is no pneumatosis or free air. No evidence for a small bowel obstruction. --Colon: No focal abnormality. --Appendix: Normal. Vascular/Lymphatic: Normal course and caliber of the major abdominal vessels. --No retroperitoneal lymphadenopathy. --No mesenteric lymphadenopathy. --No pelvic or inguinal lymphadenopathy. Reproductive: Unremarkable Other: No ascites or free air. The abdominal wall is normal. Musculoskeletal. No acute displaced fractures. IMPRESSION: Mildly dilated fluid-filled loops of small bowel are scattered throughout the abdomen with some focal wall thickening of the distal ileum. Findings are suggestive of an infectious or inflammatory enteritis. There is no evidence for high-grade small bowel obstruction. Electronically Signed   By: Constance Holster M.D.   On: 05/26/2019 18:51   US Abdomen Limited RUQ  Result Date: 05/26/2019 CLINICAL DATA:  Right upper quadrant pain for 1 day. EXAM: ULTRASOUND ABDOMEN LIMITED RIGHT UPPER QUADRANT COMPARISON:  None. FINDINGS: Gallbladder: No gallstones or wall thickening visualized. No sonographic Murphy sign noted by sonographer. Common bile duct:  Diameter: 3.3 mm Liver: No focal lesion identified. Patchy increased parenchymal echogenicity usually associated with hepatic steatosis. Portal vein is patent on color Doppler imaging with normal direction of blood flow towards the liver. Other: None. IMPRESSION: Hepatic steatosis. No evidence of cholelithiasis or acute cholecystitis. Electronically Signed   By: Fidela Salisbury M.D.   On: 05/26/2019 18:03      Labs: BNP (last 3 results) No results for input(s): BNP in the last 8760 hours. Basic Metabolic Panel: Recent Labs  Lab 05/26/19 1538 05/27/19 0404 05/28/19 0413  NA 141 141 138  K 4.7 3.7 3.5  CL 106 111 105  CO2 26 24 26   GLUCOSE 125* 94 84  BUN 13 10 7   CREATININE 0.73 0.75 0.78  CALCIUM 10.0 9.1 8.8*  MG  --   --  2.0   Liver Function Tests: Recent Labs  Lab 05/26/19 1538  AST 22  ALT 17  ALKPHOS 47  BILITOT 0.6  PROT 8.4*  ALBUMIN 4.6   Recent Labs  Lab 05/26/19 1538  LIPASE 26   No results for input(s): AMMONIA in the last 168 hours. CBC: Recent Labs  Lab 05/26/19 1538 05/27/19 0404 05/28/19 0413  WBC 17.0* 18.3* 10.6*  HGB 15.8* 12.8 11.8*  HCT 48.4* 40.0 36.1  MCV 86.1 85.3 84.5  PLT 454* 439* 374   Cardiac Enzymes: No results for input(s): CKTOTAL, CKMB, CKMBINDEX, TROPONINI in the last 168 hours. BNP: Invalid input(s): POCBNP CBG: No results for input(s): GLUCAP in the last 168 hours. D-Dimer No results for input(s): DDIMER in the last 72 hours. Hgb A1c No results for  input(s): HGBA1C in the last 72 hours. Lipid Profile No results for input(s): CHOL, HDL, LDLCALC, TRIG, CHOLHDL, LDLDIRECT in the last 72 hours. Thyroid function studies No results for input(s): TSH, T4TOTAL, T3FREE, THYROIDAB in the last 72 hours.  Invalid input(s): FREET3 Anemia work up No results for input(s): VITAMINB12, FOLATE, FERRITIN, TIBC, IRON, RETICCTPCT in the last 72 hours. Urinalysis    Component Value Date/Time   COLORURINE YELLOW (A) 05/26/2019  1447   APPEARANCEUR CLOUDY (A) 05/26/2019 1447   APPEARANCEUR Clear 06/17/2014 0907   LABSPEC 1.026 05/26/2019 1447   LABSPEC 1.021 06/17/2014 0907   PHURINE 5.0 05/26/2019 1447   GLUCOSEU NEGATIVE 05/26/2019 1447   GLUCOSEU Negative 06/17/2014 0907   HGBUR NEGATIVE 05/26/2019 1447   BILIRUBINUR NEGATIVE 05/26/2019 1447   BILIRUBINUR Negative 06/17/2014 0907   KETONESUR NEGATIVE 05/26/2019 1447   PROTEINUR 100 (A) 05/26/2019 1447   NITRITE NEGATIVE 05/26/2019 1447   LEUKOCYTESUR LARGE (A) 05/26/2019 1447   LEUKOCYTESUR Negative 06/17/2014 0907   Sepsis Labs Invalid input(s): PROCALCITONIN,  WBC,  LACTICIDVEN Microbiology Recent Results (from the past 240 hour(s))  SARS CORONAVIRUS 2 (TAT 6-24 HRS) Nasopharyngeal Nasopharyngeal Swab     Status: None   Collection Time: 05/26/19  9:04 PM   Specimen: Nasopharyngeal Swab  Result Value Ref Range Status   SARS Coronavirus 2 NEGATIVE NEGATIVE Final    Comment: (NOTE) SARS-CoV-2 target nucleic acids are NOT DETECTED. The SARS-CoV-2 RNA is generally detectable in upper and lower respiratory specimens during the acute phase of infection. Negative results do not preclude SARS-CoV-2 infection, do not rule out co-infections with other pathogens, and should not be used as the sole basis for treatment or other patient management decisions. Negative results must be combined with clinical observations, patient history, and epidemiological information. The expected result is Negative. Fact Sheet for Patients: HairSlick.no Fact Sheet for Healthcare Providers: quierodirigir.com This test is not yet approved or cleared by the [place] FDA and  has been authorized for detection and/or diagnosis of SARS-CoV-2 by FDA under an Emergency Use Authorization (EUA). This EUA will remain  in effect (meaning this test can be used) for the duration of the COVID-19 declaration under Section 56  4(b)(1) of the Act, 21 U.S.C. section 360bbb-3(b)(1), unless the authorization is terminated or revoked sooner. Performed at [facility], [address] [place], [place] [address]      Total time spend on discharging this patient, including the last patient exam, discussing the hospital stay, instructions for ongoing care as it relates to all pertinent caregivers, as well as preparing the medical discharge records, prescriptions, and/or referrals as applicable, is 30 minutes.    Darlin Priestly, MD  [facility] 05/28/2019, 4:02 PM  If 7PM-7AM, please contact night-coverage

## 2019-07-19 ENCOUNTER — Emergency Department: Payer: Self-pay

## 2019-07-19 ENCOUNTER — Other Ambulatory Visit: Payer: Self-pay

## 2019-07-19 ENCOUNTER — Encounter: Payer: Self-pay | Admitting: Emergency Medicine

## 2019-07-19 ENCOUNTER — Emergency Department
Admission: EM | Admit: 2019-07-19 | Discharge: 2019-07-19 | Disposition: A | Discharge status: Home / Self Care | Payer: Self-pay | Attending: Emergency Medicine | Admitting: Emergency Medicine

## 2019-07-19 DIAGNOSIS — R109 Unspecified abdominal pain: Secondary | ICD-10-CM

## 2019-07-19 DIAGNOSIS — N3000 Acute cystitis without hematuria: Secondary | ICD-10-CM | POA: Insufficient documentation

## 2019-07-19 DIAGNOSIS — Z79899 Other long term (current) drug therapy: Secondary | ICD-10-CM | POA: Insufficient documentation

## 2019-07-19 LAB — COMPREHENSIVE METABOLIC PANEL
ALT: 13 U/L (ref 0–44)
AST: 17 U/L (ref 15–41)
Albumin: 3.7 g/dL (ref 3.5–5.0)
Alkaline Phosphatase: 35 U/L — ABNORMAL LOW (ref 38–126)
Anion gap: 7 (ref 5–15)
BUN: 7 mg/dL (ref 6–20)
CO2: 27 mmol/L (ref 22–32)
Calcium: 8.9 mg/dL (ref 8.9–10.3)
Chloride: 108 mmol/L (ref 98–111)
Creatinine, Ser: 0.78 mg/dL (ref 0.44–1.00)
GFR calc Af Amer: >60 mL/min (ref >60)
GFR calc non Af Amer: >60 mL/min (ref >60)
Glucose, Bld: 95 mg/dL (ref 70–99)
Potassium: 3.9 mmol/L (ref 3.5–5.1)
Sodium: 142 mmol/L (ref 135–145)
Total Bilirubin: 0.5 mg/dL (ref 0.3–1.2)
Total Protein: 6.6 g/dL (ref 6.5–8.1)

## 2019-07-19 LAB — URINALYSIS, COMPLETE (UACMP) WITH MICROSCOPIC
Bacteria, UA: NONE SEEN
Bilirubin Urine: NEGATIVE
Glucose, UA: NEGATIVE mg/dL
Hgb urine dipstick: NEGATIVE
Ketones, ur: NEGATIVE mg/dL
Nitrite: NEGATIVE
Protein, ur: NEGATIVE mg/dL
Specific Gravity, Urine: >1.046 — ABNORMAL HIGH (ref 1.005–1.030)
pH: 6 (ref 5.0–8.0)

## 2019-07-19 LAB — CBC
HCT: 42.7 % (ref 36.0–46.0)
Hemoglobin: 13.6 g/dL (ref 12.0–15.0)
MCH: 27.8 pg (ref 26.0–34.0)
MCHC: 31.9 g/dL (ref 30.0–36.0)
MCV: 87.1 fL (ref 80.0–100.0)
Platelets: 347 10*3/uL (ref 150–400)
RBC: 4.9 MIL/uL (ref 3.87–5.11)
RDW: 13.9 % (ref 11.5–15.5)
WBC: 11.9 10*3/uL — ABNORMAL HIGH (ref 4.0–10.5)
nRBC: 0 % (ref 0.0–0.2)

## 2019-07-19 LAB — HCG, QUANTITATIVE, PREGNANCY: hCG, Beta Chain, Quant, S: <1 m[IU]/mL (ref <5)

## 2019-07-19 LAB — LIPASE, BLOOD: Lipase: 31 U/L (ref 11–51)

## 2019-07-19 MED ORDER — SODIUM CHLORIDE 0.9 % IV BOLUS
1000.0000 mL | Freq: Once | INTRAVENOUS | Status: AC
  Administered 2019-07-19: 1000 mL via INTRAVENOUS

## 2019-07-19 MED ORDER — ONDANSETRON HCL 4 MG/2ML IJ SOLN
4.0000 mg | Freq: Once | INTRAMUSCULAR | Status: AC
  Administered 2019-07-19: 4 mg via INTRAVENOUS
  Filled 2019-07-19: qty 2

## 2019-07-19 MED ORDER — KETOROLAC TROMETHAMINE 30 MG/ML IJ SOLN
15.0000 mg | Freq: Once | INTRAMUSCULAR | Status: AC
  Administered 2019-07-19: 15 mg via INTRAVENOUS
  Filled 2019-07-19: qty 1

## 2019-07-19 MED ORDER — IOHEXOL 300 MG/ML  SOLN
100.0000 mL | Freq: Once | INTRAMUSCULAR | Status: AC | PRN
  Administered 2019-07-19: 100 mL via INTRAVENOUS
  Filled 2019-07-19: qty 100

## 2019-07-19 MED ORDER — MORPHINE SULFATE (PF) 4 MG/ML IV SOLN
4.0000 mg | Freq: Once | INTRAVENOUS | Status: AC
  Administered 2019-07-19: 4 mg via INTRAVENOUS
  Filled 2019-07-19: qty 1

## 2019-07-19 MED ORDER — ONDANSETRON 4 MG PO TBDP
4.0000 mg | ORAL_TABLET | Freq: Three times a day (TID) | ORAL | 0 refills | Status: DC | PRN
Start: 2019-07-19 — End: 2020-04-08

## 2019-07-19 MED ORDER — SODIUM CHLORIDE 0.9 % IV SOLN
2.0000 g | Freq: Once | INTRAVENOUS | Status: AC
  Administered 2019-07-19: 2 g via INTRAVENOUS
  Filled 2019-07-19: qty 2

## 2019-07-19 MED ORDER — HYDROCODONE-ACETAMINOPHEN 5-325 MG PO TABS: 1.0000 | ORAL_TABLET | Freq: Four times a day (QID) | ORAL | 0 refills | Status: AC | PRN

## 2019-07-19 MED ORDER — MORPHINE SULFATE (PF) 4 MG/ML IV SOLN: 4.0000 mg | Freq: Once | INTRAVENOUS | Status: DC

## 2019-07-19 MED ORDER — CEFDINIR 300 MG PO CAPS: 300.0000 mg | ORAL_CAPSULE | Freq: Two times a day (BID) | ORAL | 0 refills | Status: AC

## 2019-07-19 MED ORDER — MORPHINE SULFATE (PF) 4 MG/ML IV SOLN
6.0000 mg | Freq: Once | INTRAVENOUS | Status: AC
  Administered 2019-07-19: 6 mg via INTRAVENOUS
  Filled 2019-07-19: qty 2

## 2019-07-19 NOTE — ED Notes (Signed)
PT c/o CP that started this morning around 7am. MD made aware.

## 2019-07-19 NOTE — ED Notes (Signed)
This RN at bedside attempting IV access for medication administration and CT. This RN unsuccessful at this time

## 2019-07-19 NOTE — ED Notes (Signed)
IV attempt unsuccessful x3. Blood return x3 but unable to thread. Primary RN and CT notified.

## 2019-07-19 NOTE — ED Triage Notes (Addendum)
Patient presents to the ED with right lower quadrant abdominal pain that woke her up out of her sleep at 3am.  Patient states pain is intermittently sharp and dull.  Patient denies any nausea, vomiting or diarrhea.  Patient states she has has had similar pain in the past and was seen in the ED for the same 2 months ago and patient was admitted at that time but patient is unsure as to why.  Patient reports constipation for the past several days.

## 2019-07-19 NOTE — ED Provider Notes (Signed)
[facility] Emergency Department Provider Note  ____________________________________________   First MD Initiated Contact with Patient 07/19/19 1516     (approximate)  I have reviewed the triage vital signs and the nursing notes.   HISTORY  Chief Complaint Abdominal Pain    HPI Alison Mathews is a 34 y.o. female with past medical history of anxiety, gastroenteritis requiring hospitalization several months ago, who with abdominal pain.  The patient states that her symptoms started as gradual onset of progressively worsening aching, gnawing, right upper and lower abdominal pain.  This began this morning and is progressively worsening since onset.  She said associated nausea but no vomiting.  She had some constipation but no diarrhea.  Symptoms feel somewhat similar to her previous episode of enterocolitis which did require hospitalization.  Denies known history of irritable bowel or inflammatory bowel disease.  No personal or family history of Crohn's disease.  Denies any anorexia.  No fevers.  No urinary symptoms.  Denies any vaginal bleeding or discharge.  Of note, she carries a diagnosis of gallstones but ultrasound during her hospitalization several months ago showed no evidence of cholelithiasis.        Past Medical History:  Diagnosis Date  . Anxiety   . Gall stones     Patient Active Problem List   Diagnosis Date Noted  . Gastroenteritis 05/26/2019  . Anxiety   . Sinus tachycardia 03/11/2018    History reviewed. No pertinent surgical history.  Prior to Admission medications   Medication Sig Start Date End Date Taking? Authorizing Provider  cefdinir (OMNICEF) 300 MG capsule Take 1 capsule (300 mg total) by mouth 2 (two) times daily for 7 days. 07/19/19 07/26/19  Shaune Pollack, MD  clonazePAM (KLONOPIN) 0.5 MG tablet Take 0.5 mg by mouth 2 (two) times daily.     [provider]  gabapentin (NEURONTIN) 100 MG capsule Take 100 mg by mouth 3  (three) times daily. 09/19/17 05/26/19  [provider]  HYDROcodone-acetaminophen (NORCO/VICODIN) 5-325 MG tablet Take 1-2 tablets by mouth every 6 (six) hours as needed for moderate pain or severe pain. 07/19/19 07/18/20  Shaune Pollack, MD  ondansetron (ZOFRAN ODT) 4 MG disintegrating tablet Take 1 tablet (4 mg total) by mouth every 8 (eight) hours as needed for nausea or vomiting. 07/19/19   Shaune Pollack, MD    Allergies Motrin [ibuprofen]  Family History  Problem Relation Age of Onset  . Cervical cancer Mother   . Hypertension Mother     Social History Social History   Tobacco Use  . Smoking status: Never Smoker  . Smokeless tobacco: Never Used  Substance Use Topics  . Alcohol use: No  . Drug use: No    Review of Systems  Review of Systems  Constitutional: Positive for fatigue. Negative for fever.  HENT: Negative for congestion and sore throat.   Eyes: Negative for visual disturbance.  Respiratory: Negative for cough and shortness of breath.   Cardiovascular: Negative for chest pain.  Gastrointestinal: Negative for abdominal pain, diarrhea, nausea and vomiting.  Genitourinary: Positive for dysuria and flank pain.  Musculoskeletal: Negative for back pain and neck pain.  Skin: Negative for rash and wound.  Neurological: Positive for weakness.  All other systems reviewed and are negative.    ____________________________________________  PHYSICAL EXAM:      VITAL SIGNS: ED Triage Vitals  Enc Vitals Group     BP 07/19/19 1047 110/64     Pulse Rate 07/19/19 1047 (!) 104  Resp 07/19/19 1047 18     Temp 07/19/19 1047 98.9 F (37.2 C)     Temp Source 07/19/19 1047 Oral     SpO2 07/19/19 1047 100 %     Weight 07/19/19 1048 190 lb (86.2 kg)     Height 07/19/19 1048 5\' 3"  (1.6 m)     Head Circumference --      Peak Flow --      Pain Score 07/19/19 1048 8     Pain Loc --      Pain Edu? --      Excl. in GC? --      Physical Exam Vitals and nursing  note reviewed.  Constitutional:      General: She is not in acute distress.    Appearance: She is well-developed.  HENT:     Head: Normocephalic and atraumatic.  Eyes:     Conjunctiva/sclera: Conjunctivae normal.  Cardiovascular:     Rate and Rhythm: Normal rate and regular rhythm.     Heart sounds: Normal heart sounds. No murmur. No friction rub.  Pulmonary:     Effort: Pulmonary effort is normal. No respiratory distress.     Breath sounds: Normal breath sounds. No wheezing or rales.  Abdominal:     General: There is no distension.     Palpations: Abdomen is soft.     Tenderness: There is abdominal tenderness in the right upper quadrant and right lower quadrant. There is no right CVA tenderness, left CVA tenderness, guarding or rebound.  Musculoskeletal:     Cervical back: Neck supple.  Skin:    General: Skin is warm.     Capillary Refill: Capillary refill takes less than 2 seconds.  Neurological:     Mental Status: She is alert and oriented to person, place, and time.     Motor: No abnormal muscle tone.       ____________________________________________   LABS (all labs ordered are listed, but only abnormal results are displayed)  Labs Reviewed  COMPREHENSIVE METABOLIC PANEL - Abnormal; Notable for the following components:      Result Value   Alkaline Phosphatase 35 (*)    All other components within normal limits  CBC - Abnormal; Notable for the following components:   WBC 11.9 (*)    All other components within normal limits  URINALYSIS, COMPLETE (UACMP) WITH MICROSCOPIC - Abnormal; Notable for the following components:   Color, Urine STRAW (*)    APPearance CLEAR (*)    Specific Gravity, Urine >1.046 (*)    Leukocytes,Ua LARGE (*)    All other components within normal limits  URINE CULTURE  LIPASE, BLOOD  HCG, QUANTITATIVE, PREGNANCY    ____________________________________________  EKG: None ________________________________________  RADIOLOGY All  imaging, including plain films, CT scans, and ultrasounds, independently reviewed by me, and interpretations confirmed via formal radiology reads.  ED MD interpretation:   CT A/P: No acute abnormality  Official radiology report(s): CT ABDOMEN PELVIS W CONTRAST  Result Date: 07/19/2019 CLINICAL DATA:  Acute right lower quadrant abdominal pain. EXAM: CT ABDOMEN AND PELVIS WITH CONTRAST TECHNIQUE: Multidetector CT imaging of the abdomen and pelvis was performed using the standard protocol following bolus administration of intravenous contrast. CONTRAST:  07/21/2019 OMNIPAQUE IOHEXOL 300 MG/ML  SOLN COMPARISON:  May 26, 2019. FINDINGS: Lower chest: No acute abnormality. Hepatobiliary: No focal liver abnormality is seen. No gallstones, gallbladder wall thickening, or biliary dilatation. Pancreas: Unremarkable. No pancreatic ductal dilatation or surrounding inflammatory changes. Spleen: Normal in size without  focal abnormality. Adrenals/Urinary Tract: Adrenal glands are unremarkable. Kidneys are normal, without renal calculi, focal lesion, or hydronephrosis. Bladder is unremarkable. Stomach/Bowel: Stomach is within normal limits. Appendix appears normal. No evidence of bowel wall thickening, distention, or inflammatory changes. Vascular/Lymphatic: No significant vascular findings are present. No enlarged abdominal or pelvic lymph nodes. Reproductive: Uterus and bilateral adnexa are unremarkable. Other: No abdominal wall hernia or abnormality. No abdominopelvic ascites. Musculoskeletal: No acute or significant osseous findings. IMPRESSION: No abnormality seen in the abdomen or pelvis. Electronically Signed   By: Lupita Raider M.D.   On: 07/19/2019 17:55    ____________________________________________  PROCEDURES   Procedure(s) performed (including Critical Care):  Procedures  ____________________________________________  INITIAL IMPRESSION / MDM / ASSESSMENT AND PLAN / ED COURSE  As part of my medical  decision making, I reviewed the following data within the electronic MEDICAL RECORD NUMBER Nursing notes reviewed and incorporated, Old chart reviewed, Notes from prior ED visits, and [place] Controlled Substance Database       *ADYSSON REVELLE was evaluated in Emergency Department on 07/19/2019 for the symptoms described in the history of present illness. She was evaluated in the context of the global COVID-19 pandemic, which necessitated consideration that the patient might be at risk for infection with the SARS-CoV-2 virus that causes COVID-19. Institutional protocols and algorithms that pertain to the evaluation of patients at risk for COVID-19 are in a state of rapid change based on information released by regulatory bodies including the CDC and federal and state organizations. These policies and algorithms were followed during the patient's care in the ED.  Some ED evaluations and interventions may be delayed as a result of limited staffing during the pandemic.*     Medical Decision Making:  34 yo F here with R flank and abdominal pain. Labs show mild leukocytosis, UA concerning for UTI but are otherwise unremarkable. CT A/P negative for acute abnormality, specifically no renal stone, cholecystitis, or other abnormality. Suspect UTI with possible early pyelo, versus less MSK abdominal wall/flank pain. Pt given fluids, rocephin and feels much better in ED. No n/v, or signs of sepsis. Will treat with abx, analgesics, and outpatient follow-up.  ____________________________________________  FINAL CLINICAL IMPRESSION(S) / ED DIAGNOSES  Final diagnoses:  Flank pain  Acute cystitis without hematuria     MEDICATIONS GIVEN DURING THIS VISIT:  Medications  sodium chloride 0.9 % bolus 1,000 mL (0 mLs Intravenous Stopped 07/19/19 1910)  morphine 4 MG/ML injection 4 mg (4 mg Intravenous Given 07/19/19 1716)  ondansetron (ZOFRAN) injection 4 mg (4 mg Intravenous Given 07/19/19 1718)  ketorolac (TORADOL) 30 MG/ML  injection 15 mg (15 mg Intravenous Given 07/19/19 1717)  iohexol (OMNIPAQUE) 300 MG/ML solution 100 mL (100 mLs Intravenous Contrast Given 07/19/19 1724)  cefTRIAXone (ROCEPHIN) 2 g in sodium chloride 0.9 % 100 mL IVPB (0 g Intravenous Stopped 07/19/19 2051)  morphine 4 MG/ML injection 6 mg (6 mg Intravenous Given 07/19/19 1917)  sodium chloride 0.9 % bolus 1,000 mL (0 mLs Intravenous Stopped 07/19/19 2051)     ED Discharge Orders         Ordered    cefdinir (OMNICEF) 300 MG capsule  2 times daily     07/19/19 2012    HYDROcodone-acetaminophen (NORCO/VICODIN) 5-325 MG tablet  Every 6 hours PRN     07/19/19 2012    ondansetron (ZOFRAN ODT) 4 MG disintegrating tablet  Every 8 hours PRN     07/19/19 2012  Note:  This document was prepared using Dragon voice recognition software and may include unintentional dictation errors.   Duffy Bruce, MD 07/19/19 2226

## 2019-10-26 ENCOUNTER — Other Ambulatory Visit: Payer: Self-pay

## 2019-10-26 DIAGNOSIS — R600 Localized edema: Secondary | ICD-10-CM | POA: Insufficient documentation

## 2019-10-26 DIAGNOSIS — M25472 Effusion, left ankle: Secondary | ICD-10-CM | POA: Insufficient documentation

## 2019-10-26 DIAGNOSIS — Z79899 Other long term (current) drug therapy: Secondary | ICD-10-CM | POA: Insufficient documentation

## 2019-10-26 DIAGNOSIS — F419 Anxiety disorder, unspecified: Secondary | ICD-10-CM | POA: Insufficient documentation

## 2019-10-26 NOTE — ED Triage Notes (Signed)
Pt presents via POV c/o bilateral ankle swelling since yesterday. Denies injuries. Reports left ankle pain but denies on right. Ambulatory to triage.

## 2019-10-27 ENCOUNTER — Emergency Department
Admission: EM | Admit: 2019-10-27 | Discharge: 2019-10-27 | Disposition: A | Discharge status: Home / Self Care | Payer: Self-pay | Attending: Emergency Medicine | Admitting: Emergency Medicine

## 2019-10-27 ENCOUNTER — Emergency Department: Payer: Self-pay

## 2019-10-27 DIAGNOSIS — M25472 Effusion, left ankle: Secondary | ICD-10-CM

## 2019-10-27 LAB — CBC WITH DIFFERENTIAL/PLATELET
Abs Immature Granulocytes: 0.02 10*3/uL (ref 0.00–0.07)
Basophils Absolute: 0.1 10*3/uL (ref 0.0–0.1)
Basophils Relative: 1 %
Eosinophils Absolute: 0.5 10*3/uL (ref 0.0–0.5)
Eosinophils Relative: 4 %
HCT: 44.7 % (ref 36.0–46.0)
Hemoglobin: 14.2 g/dL (ref 12.0–15.0)
Immature Granulocytes: 0 %
Lymphocytes Relative: 34 %
Lymphs Abs: 4 10*3/uL (ref 0.7–4.0)
MCH: 28 pg (ref 26.0–34.0)
MCHC: 31.8 g/dL (ref 30.0–36.0)
MCV: 88 fL (ref 80.0–100.0)
Monocytes Absolute: 0.7 10*3/uL (ref 0.1–1.0)
Monocytes Relative: 6 %
Neutro Abs: 6.6 10*3/uL (ref 1.7–7.7)
Neutrophils Relative %: 55 %
Platelets: 432 10*3/uL — ABNORMAL HIGH (ref 150–400)
RBC: 5.08 MIL/uL (ref 3.87–5.11)
RDW: 13.2 % (ref 11.5–15.5)
WBC: 11.9 10*3/uL — ABNORMAL HIGH (ref 4.0–10.5)
nRBC: 0 % (ref 0.0–0.2)

## 2019-10-27 LAB — BASIC METABOLIC PANEL
Anion gap: 7 (ref 5–15)
BUN: 6 mg/dL (ref 6–20)
CO2: 27 mmol/L (ref 22–32)
Calcium: 8.8 mg/dL — ABNORMAL LOW (ref 8.9–10.3)
Chloride: 103 mmol/L (ref 98–111)
Creatinine, Ser: 1.04 mg/dL — ABNORMAL HIGH (ref 0.44–1.00)
GFR calc Af Amer: >60 mL/min (ref >60)
GFR calc non Af Amer: >60 mL/min (ref >60)
Glucose, Bld: 90 mg/dL (ref 70–99)
Potassium: 4 mmol/L (ref 3.5–5.1)
Sodium: 137 mmol/L (ref 135–145)

## 2019-10-27 LAB — BRAIN NATRIURETIC PEPTIDE: B Natriuretic Peptide: 24.6 pg/mL (ref 0.0–100.0)

## 2019-10-27 MED ORDER — OXYCODONE-ACETAMINOPHEN 5-325 MG PO TABS
1.0000 | ORAL_TABLET | Freq: Once | ORAL | Status: AC
  Administered 2019-10-27: 1 via ORAL
  Filled 2019-10-27: qty 1

## 2019-10-27 NOTE — ED Provider Notes (Signed)
[facility] Emergency Department Provider Note   ____________________________________________   First MD Initiated Contact with Patient 10/27/19 [phone]     (approximate)  I have reviewed the triage vital signs and the nursing notes.   HISTORY  Chief Complaint Joint Swelling    HPI Alison Mathews is a 34 y.o. female with past medical history of generalized anxiety disorder who presents to the ED complaining of ankle swelling.  Patient states that she noticed increased swelling to her left ankle starting yesterday afternoon that has progressively worsened since then.  She noticed 2 small insect bites as well to the dorsum of her left foot, but does not remember being bit by anything.  Swelling has seemed to spread upwards to her anterior lower leg and she has pain when she attempts to bear weight on this left leg.  She denies any recent trauma to her ankle or leg, has not had any pain or swelling on the right side.  She denies any cough, chest pain, or shortness of breath.        Past Medical History:  Diagnosis Date  . Anxiety   . Anxiety   . Gall stones     Patient Active Problem List   Diagnosis Date Noted  . Gastroenteritis 05/26/2019  . Anxiety   . Sinus tachycardia 03/11/2018    History reviewed. No pertinent surgical history.  Prior to Admission medications   Medication Sig Start Date End Date Taking? Authorizing Provider  clonazePAM (KLONOPIN) 0.5 MG tablet Take 0.5 mg by mouth 2 (two) times daily.     [provider]  gabapentin (NEURONTIN) 100 MG capsule Take 100 mg by mouth 3 (three) times daily. 09/19/17 05/26/19  [provider]  HYDROcodone-acetaminophen (NORCO/VICODIN) 5-325 MG tablet Take 1-2 tablets by mouth every 6 (six) hours as needed for moderate pain or severe pain. 07/19/19 07/18/20  Shaune Pollack, MD  ondansetron (ZOFRAN ODT) 4 MG disintegrating tablet Take 1 tablet (4 mg total) by mouth every 8 (eight) hours as  needed for nausea or vomiting. 07/19/19   Shaune Pollack, MD    Allergies Motrin [ibuprofen]  Family History  Problem Relation Age of Onset  . Cervical cancer Mother   . Hypertension Mother     Social History Social History   Tobacco Use  . Smoking status: Never Smoker  . Smokeless tobacco: Never Used  Substance Use Topics  . Alcohol use: No  . Drug use: No    Review of Systems  Constitutional: No fever/chills Eyes: No visual changes. ENT: No sore throat. Cardiovascular: Denies chest pain. Respiratory: Denies shortness of breath. Gastrointestinal: No abdominal pain.  No nausea, no vomiting.  No diarrhea.  No constipation. Genitourinary: Negative for dysuria. Musculoskeletal: Negative for back pain.  Positive for left ankle pain and swelling. Skin: Negative for rash. Neurological: Negative for headaches, focal weakness or numbness.  ____________________________________________   PHYSICAL EXAM:  VITAL SIGNS: ED Triage Vitals  Enc Vitals Group     BP 10/26/19 2112 134/79     Pulse Rate 10/26/19 2112 100     Resp 10/26/19 2112 18     Temp 10/26/19 2112 98.4 F (36.9 C)     Temp Source 10/26/19 2112 Oral     SpO2 10/26/19 2112 98 %     Weight 10/26/19 2112 190 lb (86.2 kg)     Height 10/26/19 2112 5\' 3"  (1.6 m)     Head Circumference --      Peak  Flow --      Pain Score 10/26/19 2108 7     Pain Loc --      Pain Edu? --      Excl. in GC? --     Constitutional: Alert and oriented. Eyes: Conjunctivae are normal. Head: Atraumatic. Nose: No congestion/rhinnorhea. Mouth/Throat: Mucous membranes are moist. Neck: Normal ROM Cardiovascular: Normal rate, regular rhythm. Grossly normal heart sounds. Respiratory: Normal respiratory effort.  No retractions. Lungs CTAB. Gastrointestinal: Soft and nontender. No distention. Genitourinary: deferred Musculoskeletal: Mild edema noted to left ankle and distal left lower extremity with 2 punctate insect bites to dorsum of  left foot just distal to the ankle.  2+ DP pulses bilaterally, no swelling or tenderness noted to right ankle. Neurologic:  Normal speech and language. No gross focal neurologic deficits are appreciated. Skin:  Skin is warm, dry and intact. No rash noted. Psychiatric: Mood and affect are normal. Speech and behavior are normal.  ____________________________________________   LABS (all labs ordered are listed, but only abnormal results are displayed)  Labs Reviewed  CBC WITH DIFFERENTIAL/PLATELET - Abnormal; Notable for the following components:      Result Value   WBC 11.9 (*)    Platelets 432 (*)    All other components within normal limits  BASIC METABOLIC PANEL - Abnormal; Notable for the following components:   Creatinine, Ser 1.04 (*)    Calcium 8.8 (*)    All other components within normal limits  BRAIN NATRIURETIC PEPTIDE     PROCEDURES  Procedure(s) performed (including Critical Care):  Procedures   ____________________________________________   INITIAL IMPRESSION / ASSESSMENT AND PLAN / ED COURSE       34 year old female with past medical history of generalized anxiety disorder presents to the ED complaining of left ankle pain and swelling gradually worsening since yesterday and associated with what appears to be 2 small insect bites.  She is neurovascularly intact to her left lower extremity but does have tenderness around her ankle and left lower leg.  We will further assess with x-ray of left ankle as well as ultrasound to rule out DVT.  She currently denies any chest pain or shortness of breath to raise suspicion for PE.  Lab work and chest x-ray are thus far unremarkable.  We will treat patient's pain with Percocet.  X-ray and ultrasound are negative for acute process, no evidence of DVT or acute bony injury.  Patient is appropriate for discharge home, was counseled to alternate Tylenol and ibuprofen, also apply ice to help with swelling.  She was counseled to  return to the ED for new or worsening symptoms, patient agrees with plan.      ____________________________________________   FINAL CLINICAL IMPRESSION(S) / ED DIAGNOSES  Final diagnoses:  Left ankle swelling     ED Discharge Orders    None       Note:  This document was prepared using Dragon voice recognition software and may include unintentional dictation errors.   Chesley Noon, MD 10/27/19 1029

## 2019-10-27 NOTE — ED Notes (Signed)
Pt given graham crackers and cola with Dr. Larinda Buttery, MD approval

## 2019-10-27 NOTE — ED Notes (Signed)
Pt presents to the ED for L ankle swelling and pain. Pt states she noticed yesterday. Swelling noted to L ankle, pulse present. Small insect bite noted to the L ankle also. Pt states that she is able to ambulate with the foot but it is painful. Pt rates pain 7/10 and describes as sharp shooting pain. Denies fever, CP, and SOB at this time. Pt is A&Ox4 and NAD.

## 2019-10-27 NOTE — ED Notes (Signed)
Pt signed d/c signature on a papercopy

## 2020-04-08 ENCOUNTER — Other Ambulatory Visit: Payer: Self-pay

## 2020-04-08 ENCOUNTER — Encounter: Payer: Self-pay | Admitting: *Deleted

## 2020-04-08 ENCOUNTER — Emergency Department: Payer: Self-pay

## 2020-04-08 ENCOUNTER — Emergency Department
Admission: EM | Admit: 2020-04-08 | Discharge: 2020-04-08 | Disposition: A | Discharge status: Home / Self Care | Payer: Self-pay | Attending: Emergency Medicine | Admitting: Emergency Medicine

## 2020-04-08 DIAGNOSIS — B9689 Other specified bacterial agents as the cause of diseases classified elsewhere: Secondary | ICD-10-CM | POA: Insufficient documentation

## 2020-04-08 DIAGNOSIS — N390 Urinary tract infection, site not specified: Secondary | ICD-10-CM | POA: Insufficient documentation

## 2020-04-08 LAB — URINALYSIS, COMPLETE (UACMP) WITH MICROSCOPIC
Bacteria, UA: NONE SEEN
Bilirubin Urine: NEGATIVE
Glucose, UA: NEGATIVE mg/dL
Hgb urine dipstick: NEGATIVE
Ketones, ur: NEGATIVE mg/dL
Nitrite: NEGATIVE
Protein, ur: NEGATIVE mg/dL
RBC / HPF: >50 RBC/hpf — ABNORMAL HIGH (ref 0–5)
Specific Gravity, Urine: 1.02 (ref 1.005–1.030)
WBC, UA: >50 WBC/hpf — ABNORMAL HIGH (ref 0–5)
pH: 5 (ref 5.0–8.0)

## 2020-04-08 LAB — COMPREHENSIVE METABOLIC PANEL
ALT: 12 U/L (ref 0–44)
AST: 16 U/L (ref 15–41)
Albumin: 3.6 g/dL (ref 3.5–5.0)
Alkaline Phosphatase: 37 U/L — ABNORMAL LOW (ref 38–126)
Anion gap: 5 (ref 5–15)
BUN: 7 mg/dL (ref 6–20)
CO2: 27 mmol/L (ref 22–32)
Calcium: 8.6 mg/dL — ABNORMAL LOW (ref 8.9–10.3)
Chloride: 105 mmol/L (ref 98–111)
Creatinine, Ser: 0.72 mg/dL (ref 0.44–1.00)
GFR, Estimated: >60 mL/min (ref >60)
Glucose, Bld: 89 mg/dL (ref 70–99)
Potassium: 3.9 mmol/L (ref 3.5–5.1)
Sodium: 137 mmol/L (ref 135–145)
Total Bilirubin: 0.4 mg/dL (ref 0.3–1.2)
Total Protein: 6.7 g/dL (ref 6.5–8.1)

## 2020-04-08 LAB — CBC
HCT: 43.6 % (ref 36.0–46.0)
Hemoglobin: 13.9 g/dL (ref 12.0–15.0)
MCH: 27.4 pg (ref 26.0–34.0)
MCHC: 31.9 g/dL (ref 30.0–36.0)
MCV: 86 fL (ref 80.0–100.0)
Platelets: 396 10*3/uL (ref 150–400)
RBC: 5.07 MIL/uL (ref 3.87–5.11)
RDW: 13.6 % (ref 11.5–15.5)
WBC: 14.9 10*3/uL — ABNORMAL HIGH (ref 4.0–10.5)
nRBC: 0 % (ref 0.0–0.2)

## 2020-04-08 LAB — POC URINE PREG, ED: Preg Test, Ur: NEGATIVE

## 2020-04-08 LAB — LIPASE, BLOOD: Lipase: 130 U/L — ABNORMAL HIGH (ref 11–51)

## 2020-04-08 MED ORDER — IOHEXOL 12 MG/ML PO SOLN
500.0000 mL | ORAL | Status: AC
  Administered 2020-04-08 (×2): 500 mL via ORAL

## 2020-04-08 MED ORDER — ONDANSETRON HCL 4 MG/2ML IJ SOLN
4.0000 mg | Freq: Once | INTRAMUSCULAR | Status: AC
  Administered 2020-04-08: 4 mg via INTRAVENOUS
  Filled 2020-04-08: qty 2

## 2020-04-08 MED ORDER — MORPHINE SULFATE (PF) 4 MG/ML IV SOLN
4.0000 mg | Freq: Once | INTRAVENOUS | Status: AC
  Administered 2020-04-08: 4 mg via INTRAVENOUS
  Filled 2020-04-08: qty 1

## 2020-04-08 MED ORDER — CEPHALEXIN 500 MG PO CAPS: 500.0000 mg | ORAL_CAPSULE | Freq: Three times a day (TID) | ORAL | 0 refills | Status: AC

## 2020-04-08 MED ORDER — IOHEXOL 300 MG/ML  SOLN
100.0000 mL | Freq: Once | INTRAMUSCULAR | Status: AC | PRN
  Administered 2020-04-08: 100 mL via INTRAVENOUS

## 2020-04-08 MED ORDER — SODIUM CHLORIDE 0.9 % IV SOLN
1.0000 g | Freq: Once | INTRAVENOUS | Status: AC
  Administered 2020-04-08: 1 g via INTRAVENOUS
  Filled 2020-04-08: qty 10

## 2020-04-08 MED ORDER — ONDANSETRON 4 MG PO TBDP
4.0000 mg | ORAL_TABLET | Freq: Four times a day (QID) | ORAL | 0 refills | Status: DC | PRN
Start: 2020-04-08 — End: 2020-12-02

## 2020-04-08 NOTE — ED Notes (Signed)
Pt ambulatory to toilet,

## 2020-04-08 NOTE — ED Triage Notes (Signed)
Pt ambulatory to triage.  Pt has right side abd pain.  Pt has nausea.  No back pain.  No vag bleeding.  No urinary sx.  Pt alert  Speech clear.  Sx for 3 days.

## 2020-04-08 NOTE — ED Notes (Signed)
poc pregnancy Negative 

## 2020-04-08 NOTE — Discharge Instructions (Addendum)
You may alternate Tylenol 1000 mg every 6 hours as needed for pain, fever and Ibuprofen 800 mg every 8 hours as needed for pain, fever.  Please take Ibuprofen with food.  Do not take more than 4000 mg of Tylenol (acetaminophen) in a 24 hour period.  

## 2020-04-08 NOTE — ED Provider Notes (Signed)
[facility] Emergency Department Provider Note  ____________________________________________   Event Date/Time   First MD Initiated Contact with Patient 04/08/20 0236     (approximate)  I have reviewed the triage vital signs and the nursing notes.   HISTORY  Chief Complaint Abdominal Pain    HPI Alison Mathews is a 35 y.o. female with history of gallstones who presents to the emergency department with right-sided abdominal and flank pain for the past few days.  She has had nausea but no vomiting or diarrhea.  Reports she has had some dysuria, urinary frequency and urgency.  No vaginal bleeding or discharge.  Not on her menstrual cycle.  LMP 04/01/2020.  No fever.  Denies aggravating or alleviating factors.  Previous abdominal surgery.        Past Medical History:  Diagnosis Date  . Anxiety   . Anxiety   . Gall stones     Patient Active Problem List   Diagnosis Date Noted  . Gastroenteritis 05/26/2019  . Anxiety   . Sinus tachycardia 03/11/2018    No past surgical history on file.  Prior to Admission medications   Medication Sig Start Date End Date Taking? Authorizing Provider  cephALEXin (KEFLEX) 500 MG capsule Take 1 capsule (500 mg total) by mouth 3 (three) times daily for 10 days. 04/08/20 04/18/20 Yes Lorisa Scheid, Baxter Hire N, DO  ondansetron (ZOFRAN ODT) 4 MG disintegrating tablet Take 1 tablet (4 mg total) by mouth every 6 (six) hours as needed for nausea or vomiting. 04/08/20  Yes Sheccid Lahmann, Layla Maw, DO  clonazePAM (KLONOPIN) 0.5 MG tablet Take 0.5 mg by mouth 2 (two) times daily.     [provider]  gabapentin (NEURONTIN) 100 MG capsule Take 100 mg by mouth 3 (three) times daily. 09/19/17 05/26/19  [provider]  HYDROcodone-acetaminophen (NORCO/VICODIN) 5-325 MG tablet Take 1-2 tablets by mouth every 6 (six) hours as needed for moderate pain or severe pain. 07/19/19 07/18/20  Shaune Pollack, MD    Allergies Motrin  [ibuprofen]  Family History  Problem Relation Age of Onset  . Cervical cancer Mother   . Hypertension Mother     Social History Social History   Tobacco Use  . Smoking status: Never Smoker  . Smokeless tobacco: Never Used  Substance Use Topics  . Alcohol use: No  . Drug use: No    Review of Systems Constitutional: No fever. Eyes: No visual changes. ENT: No sore throat. Cardiovascular: Denies chest pain. Respiratory: Denies shortness of breath. Gastrointestinal: No vomiting, diarrhea. Genitourinary: Negative for dysuria. Musculoskeletal: Negative for back pain. Skin: Negative for rash. Neurological: Negative for focal weakness or numbness.  ____________________________________________   PHYSICAL EXAM:  VITAL SIGNS: ED Triage Vitals  Enc Vitals Group     BP 04/08/20 0023 118/82     Pulse Rate 04/08/20 0023 86     Resp 04/08/20 0023 18     Temp 04/08/20 0023 98.2 F (36.8 C)     Temp Source 04/08/20 0023 Oral     SpO2 04/08/20 0023 96 %     Weight 04/08/20 0024 185 lb (83.9 kg)     Height 04/08/20 0024 5\' 3"  (1.6 m)     Head Circumference --      Peak Flow --      Pain Score 04/08/20 0024 7     Pain Loc --      Pain Edu? --      Excl. in GC? --    CONSTITUTIONAL:  Alert and oriented and responds appropriately to questions. Well-appearing; well-nourished HEAD: Normocephalic EYES: Conjunctivae clear, pupils appear equal, EOM appear intact ENT: normal nose; moist mucous membranes NECK: Supple, normal ROM CARD: RRR; S1 and S2 appreciated; no murmurs, no clicks, no rubs, no gallops RESP: Normal chest excursion without splinting or tachypnea; breath sounds clear and equal bilaterally; no wheezes, no rhonchi, no rales, no hypoxia or respiratory distress, speaking full sentences ABD/GI: Normal bowel sounds; non-distended; soft, tender in the right upper quadrant and right lower quadrant without guarding or rebound, negative Murphy sign BACK: The back appears normal,  no CVA tenderness EXT: Normal ROM in all joints; no deformity noted, no edema; no cyanosis SKIN: Normal color for age and race; warm; no rash on exposed skin NEURO: Moves all extremities equally PSYCH: The patient's mood and manner are appropriate.  ____________________________________________   LABS (all labs ordered are listed, but only abnormal results are displayed)  Labs Reviewed  LIPASE, BLOOD - Abnormal; Notable for the following components:      Result Value   Lipase 130 (*)    All other components within normal limits  COMPREHENSIVE METABOLIC PANEL - Abnormal; Notable for the following components:   Calcium 8.6 (*)    Alkaline Phosphatase 37 (*)    All other components within normal limits  CBC - Abnormal; Notable for the following components:   WBC 14.9 (*)    All other components within normal limits  URINALYSIS, COMPLETE (UACMP) WITH MICROSCOPIC - Abnormal; Notable for the following components:   Color, Urine YELLOW (*)    APPearance CLOUDY (*)    Leukocytes,Ua LARGE (*)    RBC / HPF >50 (*)    WBC, UA >50 (*)    All other components within normal limits  URINE CULTURE  POC URINE PREG, ED   ____________________________________________  EKG  None ____________________________________________  RADIOLOGY I, Robertson Colclough, personally viewed and evaluated these images (plain radiographs) as part of my medical decision making, as well as reviewing the written report by the radiologist.  ED MD interpretation: No acute abnormality seen on CT imaging.  Official radiology report(s): CT ABDOMEN PELVIS W CONTRAST  Result Date: 04/08/2020 CLINICAL DATA:  Acute abdominal pain, nonlocalized. Right upper quadrant and flank pains. EXAM: CT ABDOMEN AND PELVIS WITH CONTRAST TECHNIQUE: Multidetector CT imaging of the abdomen and pelvis was performed using the standard protocol following bolus administration of intravenous contrast. CONTRAST:  OMNIPAQUE IOHEXOL 300 MG/ML   SOLN COMPARISON:  07/19/2019 FINDINGS: Lower chest:  No contributory findings. Hepatobiliary: No focal liver abnormality.No evidence of biliary obstruction or stone. Pancreas: Unremarkable. Spleen: Unremarkable. Adrenals/Urinary Tract: Negative adrenals. No hydronephrosis or stone. Unremarkable bladder. Stomach/Bowel:  No obstruction. No appendicitis. Vascular/Lymphatic: No acute vascular abnormality. No mass or adenopathy. Reproductive:No pathologic findings.  Corpus luteum on the right. Other: No ascites or pneumoperitoneum. Musculoskeletal: No acute abnormalities. IMPRESSION: Negative abdominal CT. Electronically Signed   By: Marnee Spring M.D.   On: 04/08/2020 05:24    ____________________________________________   PROCEDURES  Procedure(s) performed (including Critical Care):  Procedures   ____________________________________________   INITIAL IMPRESSION / ASSESSMENT AND PLAN / ED COURSE  As part of my medical decision making, I reviewed the following data within the electronic MEDICAL RECORD NUMBER Nursing notes reviewed and incorporated, Labs reviewed, Old chart reviewed and Notes from prior ED visits         Patient here with diffuse right-sided abdominal pain.  She does have history of gallstones.  Cholelithiasis, cholecystitis,  pancreatitis, gastritis on the differential.  Labs obtained in triage show leukocytosis of 14,000.  She chronically has a leukocytosis but this appears slightly higher than her normal.  Her LFTs are normal but her lipase is slightly elevated at 130.  She is also tender in the right lower quadrant.  This also could be appendicitis, UTI, kidney stone, pyelonephritis.  Will obtain CT of the abdomen pelvis for further evaluation.  Urine pending.  Will give IV fluids, pain and nausea medicine.     3:30 AM  Pt's urine shows large leukocytes, greater than 50 red blood cells and greater than 50 white blood cells but no bacteria.  She is not currently menstruating.   Discussed with patient this could be a kidney stone, pyelonephritis.  We will add on urine culture and give IV Rocephin.   5:37 AM  Pt reports feeling better. Tolerating p.o. CT scan shows normal-appearing gallbladder, pancreas, appendix, kidney. No signs of pyelonephritis, kidney stone. Clinically, I am concerned that she could be developing pyelonephritis given she is complaining of flank pain. She is however very well-appearing and not vomiting I feel very reasonable to let her go home on oral antibiotics. She has received IV Rocephin here. Will discharge with Keflex to take three times daily for the next 10 days. Discussed return precautions. Recommend alternating Tylenol, Motrin for pain. Patient comfortable with plan.   At this time, I do not feel there is any life-threatening condition present. I have reviewed, interpreted and discussed all results (EKG, imaging, lab, urine as appropriate) and exam findings with patient/family. I have reviewed nursing notes and appropriate previous records.  I feel the patient is safe to be discharged home without further emergent workup and can continue workup as an outpatient as needed. Discussed usual and customary return precautions. Patient/family verbalize understanding and are comfortable with this plan.  Outpatient follow-up has been provided as needed. All questions have been answered.  ____________________________________________   FINAL CLINICAL IMPRESSION(S) / ED DIAGNOSES  Final diagnoses:  Acute urinary tract infection     ED Discharge Orders         Ordered    cephALEXin (KEFLEX) 500 MG capsule  3 times daily        04/08/20 0536    ondansetron (ZOFRAN ODT) 4 MG disintegrating tablet  Every 6 hours PRN        04/08/20 0536          *Please note:  Alison Mathews was evaluated in Emergency Department on 04/08/2020 for the symptoms described in the history of present illness. She was evaluated in the context of the global COVID-19  pandemic, which necessitated consideration that the patient might be at risk for infection with the SARS-CoV-2 virus that causes COVID-19. Institutional protocols and algorithms that pertain to the evaluation of patients at risk for COVID-19 are in a state of rapid change based on information released by regulatory bodies including the CDC and federal and state organizations. These policies and algorithms were followed during the patient's care in the ED.  Some ED evaluations and interventions may be delayed as a result of limited staffing during and the pandemic.*   Note:  This document was prepared using Dragon voice recognition software and may include unintentional dictation errors.   Lemond Griffee, Layla Maw, DO 04/08/20 [phone]

## 2020-04-08 NOTE — ED Notes (Signed)
See triage note, pt reports RUQ pain with nausea for past 3 days that she wanted to get checked before returning to work. Pt ambulatory to treatment room, NAD noted

## 2020-04-08 NOTE — ED Notes (Signed)
Pt provided graham crackers as requested at discharge

## 2020-04-09 LAB — URINE CULTURE: Culture: <10000 — AB

## 2020-12-01 ENCOUNTER — Other Ambulatory Visit: Payer: Self-pay

## 2020-12-01 DIAGNOSIS — L03011 Cellulitis of right finger: Secondary | ICD-10-CM | POA: Insufficient documentation

## 2020-12-01 DIAGNOSIS — R109 Unspecified abdominal pain: Secondary | ICD-10-CM | POA: Insufficient documentation

## 2020-12-01 LAB — COMPREHENSIVE METABOLIC PANEL
ALT: 12 U/L (ref 0–44)
AST: 18 U/L (ref 15–41)
Albumin: 3.5 g/dL (ref 3.5–5.0)
Alkaline Phosphatase: 38 U/L (ref 38–126)
Anion gap: 10 (ref 5–15)
BUN: 6 mg/dL (ref 6–20)
CO2: 26 mmol/L (ref 22–32)
Calcium: 8.8 mg/dL — ABNORMAL LOW (ref 8.9–10.3)
Chloride: 103 mmol/L (ref 98–111)
Creatinine, Ser: 0.86 mg/dL (ref 0.44–1.00)
GFR, Estimated: >60 mL/min (ref >60)
Glucose, Bld: 101 mg/dL — ABNORMAL HIGH (ref 70–99)
Potassium: 3.9 mmol/L (ref 3.5–5.1)
Sodium: 139 mmol/L (ref 135–145)
Total Bilirubin: 0.5 mg/dL (ref 0.3–1.2)
Total Protein: 6.7 g/dL (ref 6.5–8.1)

## 2020-12-01 LAB — LIPASE, BLOOD: Lipase: 42 U/L (ref 11–51)

## 2020-12-01 LAB — CBC
HCT: 46.3 % — ABNORMAL HIGH (ref 36.0–46.0)
Hemoglobin: 15.2 g/dL — ABNORMAL HIGH (ref 12.0–15.0)
MCH: 27.6 pg (ref 26.0–34.0)
MCHC: 32.8 g/dL (ref 30.0–36.0)
MCV: 84 fL (ref 80.0–100.0)
Platelets: 530 10*3/uL — ABNORMAL HIGH (ref 150–400)
RBC: 5.51 MIL/uL — ABNORMAL HIGH (ref 3.87–5.11)
RDW: 13 % (ref 11.5–15.5)
WBC: 14.5 10*3/uL — ABNORMAL HIGH (ref 4.0–10.5)
nRBC: 0 % (ref 0.0–0.2)

## 2020-12-01 NOTE — ED Triage Notes (Signed)
Pt  in with co right sided abd pain, no n.v.d. or dysuria. Pt also co redness and swelling around nail of right 3rd finger.

## 2020-12-02 ENCOUNTER — Emergency Department: Payer: Self-pay

## 2020-12-02 ENCOUNTER — Emergency Department
Admission: EM | Admit: 2020-12-02 | Discharge: 2020-12-02 | Disposition: A | Discharge status: Home / Self Care | Payer: Self-pay | Attending: Emergency Medicine | Admitting: Emergency Medicine

## 2020-12-02 DIAGNOSIS — R109 Unspecified abdominal pain: Secondary | ICD-10-CM

## 2020-12-02 DIAGNOSIS — L03011 Cellulitis of right finger: Secondary | ICD-10-CM

## 2020-12-02 LAB — URINALYSIS, COMPLETE (UACMP) WITH MICROSCOPIC
Bacteria, UA: NONE SEEN
Bilirubin Urine: NEGATIVE
Glucose, UA: NEGATIVE mg/dL
Hgb urine dipstick: NEGATIVE
Ketones, ur: 5 mg/dL — AB
Nitrite: NEGATIVE
Protein, ur: 30 mg/dL — AB
Specific Gravity, Urine: 1.032 — ABNORMAL HIGH (ref 1.005–1.030)
pH: 5 (ref 5.0–8.0)

## 2020-12-02 LAB — POC URINE PREG, ED: Preg Test, Ur: NEGATIVE

## 2020-12-02 MED ORDER — LIDOCAINE HCL (PF) 1 % IJ SOLN
5.0000 mL | Freq: Once | INTRAMUSCULAR | Status: AC
  Administered 2020-12-02: 5 mL
  Filled 2020-12-02: qty 5

## 2020-12-02 MED ORDER — IOHEXOL 350 MG/ML SOLN
80.0000 mL | Freq: Once | INTRAVENOUS | Status: AC | PRN
  Administered 2020-12-02: 80 mL via INTRAVENOUS

## 2020-12-02 MED ORDER — ONDANSETRON 4 MG PO TBDP
4.0000 mg | ORAL_TABLET | Freq: Three times a day (TID) | ORAL | 0 refills | Status: DC | PRN
Start: 2020-12-02 — End: 2021-04-08

## 2020-12-02 MED ORDER — BACITRACIN 500 UNIT/GM EX OINT: 1.0000 "application " | TOPICAL_OINTMENT | Freq: Two times a day (BID) | CUTANEOUS | 0 refills | Status: DC

## 2020-12-02 MED ORDER — DICYCLOMINE HCL 10 MG PO CAPS: 10.0000 mg | ORAL_CAPSULE | Freq: Four times a day (QID) | ORAL | 0 refills | Status: DC

## 2020-12-02 MED ORDER — KETOROLAC TROMETHAMINE 30 MG/ML IJ SOLN
15.0000 mg | Freq: Once | INTRAMUSCULAR | Status: AC
  Administered 2020-12-02: 15 mg via INTRAVENOUS
  Filled 2020-12-02: qty 1

## 2020-12-02 NOTE — Discharge Instructions (Addendum)

## 2020-12-02 NOTE — ED Notes (Signed)
Pt OTF with CT 

## 2020-12-02 NOTE — ED Notes (Signed)
Pt ambulatory to bathroom, able to void sans complications.   

## 2020-12-02 NOTE — ED Provider Notes (Signed)
[facility] Emergency Department Provider Note  ____________________________________________  Time seen: Approximately 3:26 AM  I have reviewed the triage vital signs and the nursing notes.   HISTORY  Chief Complaint Abdominal Pain   HPI Alison Mathews is a 35 y.o. female with a history of anxiety and obesity who presents for evaluation of abdominal pain.  Patient is complaining of right right sided sharp intermittent abdominal pain for the last 2 to 3 days.  Has not taken anything at home for the pain.  Currently the pain is 6 out of 10.  She has had this pain in the past several times with no clear diagnosis.  The pain is not worse postprandially.  No nausea, vomiting, chest pain, shortness of breath, cough, fever, dysuria, hematuria, constipation, or diarrhea.  She is also complaining of pain and swelling of her right middle finger just around the nailbed.   Past Medical History:  Diagnosis Date   Anxiety    Anxiety    Gall stones     Patient Active Problem List   Diagnosis Date Noted   Gastroenteritis 05/26/2019   Anxiety    Sinus tachycardia 03/11/2018    No past surgical history on file.  Prior to Admission medications   Medication Sig Start Date End Date Taking? Authorizing Provider  bacitracin 500 UNIT/GM ointment Apply 1 application topically 2 (two) times daily. 12/02/20  Yes Don Perking, [place], MD  dicyclomine (BENTYL) 10 MG capsule Take 1 capsule (10 mg total) by mouth 4 (four) times daily for 14 days. 12/02/20 12/16/20 Yes Samra Pesch, [place], MD  ondansetron (ZOFRAN ODT) 4 MG disintegrating tablet Take 1 tablet (4 mg total) by mouth every 8 (eight) hours as needed. 12/02/20  Yes Kamen Hanken, [place], MD  clonazePAM (KLONOPIN) 0.5 MG tablet Take 0.5 mg by mouth 2 (two) times daily.     [provider]  gabapentin (NEURONTIN) 100 MG capsule Take 100 mg by mouth 3 (three) times daily. 09/19/17 05/26/19  [provider]     Allergies Motrin [ibuprofen]  Family History  Problem Relation Age of Onset   Cervical cancer Mother    Hypertension Mother     Social History Social History   Tobacco Use   Smoking status: Never   Smokeless tobacco: Never  Substance Use Topics   Alcohol use: No   Drug use: No    Review of Systems  Constitutional: Negative for fever. Eyes: Negative for visual changes. ENT: Negative for sore throat. Neck: No neck pain  Cardiovascular: Negative for chest pain. Respiratory: Negative for shortness of breath. Gastrointestinal: + R sided abdominal pain. No vomiting or diarrhea. Genitourinary: Negative for dysuria. Musculoskeletal: Negative for back pain. + R finger swelling Skin: Negative for rash. Neurological: Negative for headaches, weakness or numbness. Psych: No SI or HI  ____________________________________________   PHYSICAL EXAM:  VITAL SIGNS: ED Triage Vitals  Enc Vitals Group     BP 12/01/20 2156 111/80     Pulse Rate 12/01/20 2156 (!) 111     Resp 12/01/20 2156 20     Temp 12/01/20 2156 97.9 F (36.6 C)     Temp Source 12/01/20 2156 Oral     SpO2 12/01/20 2156 96 %     Weight 12/01/20 2157 185 lb (83.9 kg)     Height 12/01/20 2157 5\' 2"  (1.575 m)     Head Circumference --      Peak Flow --      Pain Score 12/01/20 2157 8  Pain Loc --      Pain Edu? --      Excl. in GC? --     Constitutional: Alert and oriented. Well appearing and in no apparent distress. HEENT:      Head: Normocephalic and atraumatic.         Eyes: Conjunctivae are normal. Sclera is non-icteric.       Mouth/Throat: Mucous membranes are moist.       Neck: Supple with no signs of meningismus. Cardiovascular: Regular rate and rhythm. No murmurs, gallops, or rubs. 2+ symmetrical distal pulses are present in all extremities. No JVD. Respiratory: Normal respiratory effort. Lungs are clear to auscultation bilaterally.  Gastrointestinal: Soft, diffusely tender to palpation on  the R quadrants non distended with positive bowel sounds. No rebound or guarding. Genitourinary: No CVA tenderness. Musculoskeletal:  No edema, cyanosis, or erythema of extremities. Paronychia of the R middle finger Neurologic: Normal speech and language. Face is symmetric. Moving all extremities. No gross focal neurologic deficits are appreciated. Skin: Skin is warm, dry and intact. No rash noted. Psychiatric: Mood and affect are normal. Speech and behavior are normal.  ____________________________________________   LABS (all labs ordered are listed, but only abnormal results are displayed)  Labs Reviewed  CBC - Abnormal; Notable for the following components:      Result Value   WBC 14.5 (*)    RBC 5.51 (*)    Hemoglobin 15.2 (*)    HCT 46.3 (*)    Platelets 530 (*)    All other components within normal limits  COMPREHENSIVE METABOLIC PANEL - Abnormal; Notable for the following components:   Glucose, Bld 101 (*)    Calcium 8.8 (*)    All other components within normal limits  URINALYSIS, COMPLETE (UACMP) WITH MICROSCOPIC - Abnormal; Notable for the following components:   Color, Urine AMBER (*)    APPearance CLOUDY (*)    Specific Gravity, Urine 1.032 (*)    Ketones, ur 5 (*)    Protein, ur 30 (*)    Leukocytes,Ua LARGE (*)    All other components within normal limits  URINE CULTURE  LIPASE, BLOOD  POC URINE PREG, ED   ____________________________________________  EKG  none  ____________________________________________  RADIOLOGY  I have personally reviewed the images performed during this visit and I agree with the Radiologist's read.   Interpretation by Radiologist:  CT ABDOMEN PELVIS W CONTRAST  Result Date: 12/02/2020 CLINICAL DATA:  Right-sided abdominal pain EXAM: CT ABDOMEN AND PELVIS WITH CONTRAST TECHNIQUE: Multidetector CT imaging of the abdomen and pelvis was performed using the standard protocol following bolus administration of intravenous contrast.  CONTRAST:  35mL OMNIPAQUE IOHEXOL 350 MG/ML SOLN COMPARISON:  None. FINDINGS: LOWER CHEST: Normal. HEPATOBILIARY: Normal hepatic contours. No intra- or extrahepatic biliary dilatation. The gallbladder is normal. PANCREAS: Normal pancreas. No ductal dilatation or peripancreatic fluid collection. SPLEEN: Normal. ADRENALS/URINARY TRACT: The adrenal glands are normal. No hydronephrosis, nephroureterolithiasis or solid renal mass. The urinary bladder is normal for degree of distention STOMACH/BOWEL: There is no hiatal hernia. Normal duodenal course and caliber. No small bowel dilatation or inflammation. No focal colonic abnormality. Normal appendix. VASCULAR/LYMPHATIC: Normal course and caliber of the major abdominal vessels. No abdominal or pelvic lymphadenopathy. REPRODUCTIVE: Normal uterus. No adnexal mass. MUSCULOSKELETAL. No bony spinal canal stenosis or focal osseous abnormality. OTHER: None. IMPRESSION: No acute abnormality of the abdomen or pelvis. Electronically Signed   By: Deatra Robinson M.D.   On: 12/02/2020 03:55     ____________________________________________  PROCEDURES  Procedure(s) performed:yes Drain paronychia  Date/Time: 12/02/2020 3:29 AM Performed by: Nita Sickle, MD Authorized by: Nita Sickle, MD  Consent: Verbal consent obtained. Risks and benefits: risks, benefits and alternatives were discussed Consent given by: patient Preparation: Patient was prepped and draped in the usual sterile fashion. Local anesthesia used: yes Anesthesia: digital block  Anesthesia: Local anesthesia used: yes Local Anesthetic: lidocaine 1% without epinephrine Anesthetic total: 5 mL  Sedation: Patient sedated: no  Patient tolerance: patient tolerated the procedure well with no immediate complications     Critical Care performed:  None ____________________________________________   INITIAL IMPRESSION / ASSESSMENT AND PLAN / ED COURSE   35 y.o. female with a history of  anxiety and obesity who presents for evaluation of abdominal pain.  Patient with right-sided abdominal pain, she has tenderness to palpation on the right quadrants with no rebound or guarding.  Otherwise extremely well-appearing in no distress.  She also has paronychia of the right middle finger which was drained per procedure note above.  Differential diagnosis for the abdominal pain includes gallbladder pathology versus pancreatitis versus appendicitis versus diverticulitis versus UTI versus kidney stone versus pyelonephritis versus constipation.  Labs showing elevated white count of 14.5 and elevated platelets of 530.  Normal LFTs and lipase.  UA with leuks and squamous cells but no bacteria or nitrite.  Patient has no urinary symptoms.  Will send for culture.  Pregnancy test is negative. CT a/p pending.  IV toradol for pain control  Old medical records reviewed showing the patient has had several ultrasounds and CTs for similar abdominal pain which were all unremarkable.  I did order another CT due to abnormally elevated white count and platelets.  I discussed with patient that if the CT is negative I will recommend that she sees a GI specialist for this chronic abdominal pain.   _________________________ 4:24 AM on 12/02/2020 ----------------------------------------- CT without any acute pathology.  Will refer patient to GI in the meantime we will provide a prescription of Bentyl and Zofran as needed.  Discussed my standard return precautions.      _____________________________________________ Please note:  Patient was evaluated in Emergency Department today for the symptoms described in the history of present illness. Patient was evaluated in the context of the global COVID-19 pandemic, which necessitated consideration that the patient might be at risk for infection with the SARS-CoV-2 virus that causes COVID-19. Institutional protocols and algorithms that pertain to the evaluation of  patients at risk for COVID-19 are in a state of rapid change based on information released by regulatory bodies including the CDC and federal and state organizations. These policies and algorithms were followed during the patient's care in the ED.  Some ED evaluations and interventions may be delayed as a result of limited staffing during the pandemic.   [place] Controlled Substance Database was reviewed by me. ____________________________________________   FINAL CLINICAL IMPRESSION(S) / ED DIAGNOSES   Final diagnoses:  Abdominal pain, unspecified abdominal location  Paronychia of finger of right hand      NEW MEDICATIONS STARTED DURING THIS VISIT:  ED Discharge Orders          Ordered    Ambulatory referral to Gastroenterology        12/02/20 0425    dicyclomine (BENTYL) 10 MG capsule  4 times daily        12/02/20 0425    ondansetron (ZOFRAN ODT) 4 MG disintegrating tablet  Every 8 hours PRN  12/02/20 0425    bacitracin 500 UNIT/GM ointment  2 times daily        12/02/20 0426             Note:  This document was prepared using Dragon voice recognition software and may include unintentional dictation errors.    Nita Sickle, MD 12/02/20 [id number]

## 2020-12-03 LAB — URINE CULTURE

## 2021-04-07 ENCOUNTER — Other Ambulatory Visit: Payer: Self-pay

## 2021-04-07 DIAGNOSIS — R1110 Vomiting, unspecified: Secondary | ICD-10-CM | POA: Insufficient documentation

## 2021-04-07 DIAGNOSIS — R1032 Left lower quadrant pain: Secondary | ICD-10-CM | POA: Insufficient documentation

## 2021-04-07 NOTE — ED Triage Notes (Signed)
Pt c/o left sided abd pain that started Friday and has become progressively more painful.  Pt states she has had some emesis this morning.  Denies diarrhea and states last BM was yesterday and was normal.  Pt denies urinary symptoms at this time.  Pt A&O x4 at this time in NAD.

## 2021-04-08 ENCOUNTER — Emergency Department
Admission: EM | Admit: 2021-04-08 | Discharge: 2021-04-08 | Disposition: A | Discharge status: Home / Self Care | Payer: Self-pay | Attending: Emergency Medicine | Admitting: Emergency Medicine

## 2021-04-08 DIAGNOSIS — R1032 Left lower quadrant pain: Secondary | ICD-10-CM

## 2021-04-08 LAB — URINALYSIS, ROUTINE W REFLEX MICROSCOPIC
Bilirubin Urine: NEGATIVE
Glucose, UA: NEGATIVE mg/dL
Hgb urine dipstick: NEGATIVE
Ketones, ur: NEGATIVE mg/dL
Nitrite: NEGATIVE
Protein, ur: 30 mg/dL — AB
Specific Gravity, Urine: 1.034 — ABNORMAL HIGH (ref 1.005–1.030)
pH: 5 (ref 5.0–8.0)

## 2021-04-08 LAB — COMPREHENSIVE METABOLIC PANEL
ALT: 15 U/L (ref 0–44)
AST: 22 U/L (ref 15–41)
Albumin: 3.9 g/dL (ref 3.5–5.0)
Alkaline Phosphatase: 44 U/L (ref 38–126)
Anion gap: 5 (ref 5–15)
BUN: 10 mg/dL (ref 6–20)
CO2: 28 mmol/L (ref 22–32)
Calcium: 8.7 mg/dL — ABNORMAL LOW (ref 8.9–10.3)
Chloride: 103 mmol/L (ref 98–111)
Creatinine, Ser: 0.89 mg/dL (ref 0.44–1.00)
GFR, Estimated: >60 mL/min (ref >60)
Glucose, Bld: 109 mg/dL — ABNORMAL HIGH (ref 70–99)
Potassium: 3.7 mmol/L (ref 3.5–5.1)
Sodium: 136 mmol/L (ref 135–145)
Total Bilirubin: 0.5 mg/dL (ref 0.3–1.2)
Total Protein: 7.3 g/dL (ref 6.5–8.1)

## 2021-04-08 LAB — LIPASE, BLOOD: Lipase: 31 U/L (ref 11–51)

## 2021-04-08 LAB — CBC
HCT: 36.7 % (ref 36.0–46.0)
Hemoglobin: 11.5 g/dL — ABNORMAL LOW (ref 12.0–15.0)
MCH: 26.4 pg (ref 26.0–34.0)
MCHC: 31.3 g/dL (ref 30.0–36.0)
MCV: 84.2 fL (ref 80.0–100.0)
Platelets: 374 10*3/uL (ref 150–400)
RBC: 4.36 MIL/uL (ref 3.87–5.11)
RDW: 13.8 % (ref 11.5–15.5)
WBC: 10.3 10*3/uL (ref 4.0–10.5)
nRBC: 0 % (ref 0.0–0.2)

## 2021-04-08 LAB — POC URINE PREG, ED: Preg Test, Ur: NEGATIVE

## 2021-04-08 MED ORDER — KETOROLAC TROMETHAMINE 30 MG/ML IJ SOLN
30.0000 mg | Freq: Once | INTRAMUSCULAR | Status: AC
  Administered 2021-04-08: 30 mg via INTRAMUSCULAR
  Filled 2021-04-08: qty 1

## 2021-04-08 MED ORDER — ONDANSETRON 4 MG PO TBDP
4.0000 mg | ORAL_TABLET | Freq: Once | ORAL | Status: AC
  Administered 2021-04-08: 4 mg via ORAL
  Filled 2021-04-08: qty 1

## 2021-04-08 MED ORDER — ONDANSETRON 4 MG PO TBDP: 4.0000 mg | ORAL_TABLET | Freq: Three times a day (TID) | ORAL | 0 refills | Status: DC | PRN

## 2021-04-08 NOTE — ED Notes (Signed)
Pt given cola and graham crackers at this time. Pt tolerating.

## 2021-04-08 NOTE — ED Provider Notes (Signed)
[facility] Provider Note    Event Date/Time   First MD Initiated Contact with Patient 04/08/21 [phone]     (approximate)   History   Abdominal Pain   HPI  Alison Mathews is a 36 y.o. female who presents to the ED for evaluation of Abdominal Pain   I reviewed DC summary from 2021 where patient was admitted for enterocolitis.  She has a history of anxiety and obesity.  Patient presents to the ED for evaluation of about 4 days of diffuse left-sided abdominal discomfort.  She reports 2 episodes of emesis this morning and concerned that she has gastroenteritis like when she was admitted 2 years ago.  She denies any diarrhea or stool changes, last BM was yesterday and "normal."  She reports feeling okay right now.  Denies hematemesis or coffee-ground emesis.  She reports trying some leftover antibiotics to address a possible UTI, even though she has not had any urinary symptoms, without any improvement.  Denies any dysuria, fever.   Physical Exam   Triage Vital Signs: ED Triage Vitals  Enc Vitals Group     BP 04/07/21 2354 115/82     Pulse Rate 04/07/21 2354 95     Resp 04/07/21 2354 17     Temp 04/07/21 2354 97.8 F (36.6 C)     Temp Source 04/07/21 2354 Oral     SpO2 04/07/21 2354 96 %     Weight 04/07/21 2355 170 lb (77.1 kg)     Height 04/07/21 2355 5\' 3"  (1.6 m)     Head Circumference --      Peak Flow --      Pain Score 04/07/21 2354 7     Pain Loc --      Pain Edu? --      Excl. in [place]? --     Most recent vital signs: Vitals:   04/08/21 0232 04/08/21 0438  BP: 104/64 116/80  Pulse: 81 76  Resp: 18 19  Temp:    SpO2: 97% 96%    General: Awake, no distress.  Obese.  Pleasant and conversational. CV:  Good peripheral perfusion. RRR Resp:  Normal effort.  Abd:  No distention.  Soft and benign throughout.  No CVA tenderness, no skin changes MSK:  No deformity noted.  Neuro:  No focal deficits appreciated. Other:     ED Results /  Procedures / Treatments   Labs (all labs ordered are listed, but only abnormal results are displayed) Labs Reviewed  COMPREHENSIVE METABOLIC PANEL - Abnormal; Notable for the following components:      Result Value   Glucose, Bld 109 (*)    Calcium 8.7 (*)    All other components within normal limits  CBC - Abnormal; Notable for the following components:   Hemoglobin 11.5 (*)    All other components within normal limits  URINALYSIS, ROUTINE W REFLEX MICROSCOPIC - Abnormal; Notable for the following components:   Color, Urine YELLOW (*)    APPearance HAZY (*)    Specific Gravity, Urine 1.034 (*)    Protein, ur 30 (*)    Leukocytes,Ua SMALL (*)    Bacteria, UA RARE (*)    All other components within normal limits  LIPASE, BLOOD  POC URINE PREG, ED    EKG   RADIOLOGY   Official radiology report(s): No results found.  PROCEDURES and INTERVENTIONS:  Procedures  Medications  ketorolac (TORADOL) 30 MG/ML injection 30 mg (30 mg Intramuscular Given 04/08/21 0433)  ondansetron (ZOFRAN-ODT) disintegrating tablet 4 mg (4 mg Oral Given 04/08/21 0434)     IMPRESSION / MDM / ASSESSMENT AND PLAN / ED COURSE  I reviewed the triage vital signs and the nursing notes.  36 year old female presents to the ED with a few days of vague abdominal pain, without evidence of significant acute pathology and suitable for outpatient management.  She looks clinically well to me.  She has a benign examination without abdominal pain, tenderness, guarding or peritoneal features.  Her work-up is benign.  No leukocytosis or signs of sepsis or systemic illness.  No metabolic derangements or signs of pancreatitis.  She is not pregnant and urine has no infectious features considering her lack of symptoms.  After Toradol and Zofran, she is tolerating p.o. intake and feeling much better.  Provided prescription for Zofran and we discussed OTC medications for analgesia.  We discussed return precautions for the ED  and she is suitable for outpatient management.  Clinical Course as of 04/08/21 0456  Wed Apr 08, 2021  0455 Reassessed.  Patient reports feeling better.  She has tolerated p.o. intake.  She is requesting a handful of graham crackers for discharge to go home with [DS]    Clinical Course User Index [DS] Vladimir Crofts, MD     FINAL CLINICAL IMPRESSION(S) / ED DIAGNOSES   Final diagnoses:  Left lower quadrant abdominal pain     Rx / DC Orders   ED Discharge Orders          Ordered    ondansetron (ZOFRAN-ODT) 4 MG disintegrating tablet  Every 8 hours PRN        04/08/21 0426             Note:  This document was prepared using Dragon voice recognition software and may include unintentional dictation errors.   Vladimir Crofts, MD 04/08/21 [id number]

## 2021-04-08 NOTE — Discharge Instructions (Addendum)
Use Tylenol for pain and fevers.  Up to 1000 mg per dose, up to 4 times per day.  Do not take more than 4000 mg of Tylenol/acetaminophen within 24 hours..  Use naproxen/Aleve for anti-inflammatory pain relief. Use up to 500mg every 12 hours. Do not take more frequently than this. Do not use other NSAIDs (ibuprofen, Advil) while taking this medication. It is safe to take Tylenol with this.   

## 2021-05-05 ENCOUNTER — Emergency Department: Payer: Self-pay

## 2021-05-05 ENCOUNTER — Other Ambulatory Visit: Payer: Self-pay

## 2021-05-05 ENCOUNTER — Encounter: Payer: Self-pay | Admitting: Emergency Medicine

## 2021-05-05 ENCOUNTER — Emergency Department
Admission: EM | Admit: 2021-05-05 | Discharge: 2021-05-06 | Disposition: A | Discharge status: Home / Self Care | Payer: Self-pay | Attending: Emergency Medicine | Admitting: Emergency Medicine

## 2021-05-05 DIAGNOSIS — R1013 Epigastric pain: Secondary | ICD-10-CM | POA: Insufficient documentation

## 2021-05-05 DIAGNOSIS — R1011 Right upper quadrant pain: Secondary | ICD-10-CM | POA: Insufficient documentation

## 2021-05-05 DIAGNOSIS — R079 Chest pain, unspecified: Secondary | ICD-10-CM

## 2021-05-05 DIAGNOSIS — D72829 Elevated white blood cell count, unspecified: Secondary | ICD-10-CM | POA: Insufficient documentation

## 2021-05-05 DIAGNOSIS — R0789 Other chest pain: Secondary | ICD-10-CM | POA: Insufficient documentation

## 2021-05-05 DIAGNOSIS — R1012 Left upper quadrant pain: Secondary | ICD-10-CM | POA: Insufficient documentation

## 2021-05-05 DIAGNOSIS — R0602 Shortness of breath: Secondary | ICD-10-CM | POA: Insufficient documentation

## 2021-05-05 LAB — COMPREHENSIVE METABOLIC PANEL
ALT: 14 U/L (ref 0–44)
AST: 20 U/L (ref 15–41)
Albumin: 3.9 g/dL (ref 3.5–5.0)
Alkaline Phosphatase: 48 U/L (ref 38–126)
Anion gap: 5 (ref 5–15)
BUN: 10 mg/dL (ref 6–20)
CO2: 29 mmol/L (ref 22–32)
Calcium: 9.2 mg/dL (ref 8.9–10.3)
Chloride: 105 mmol/L (ref 98–111)
Creatinine, Ser: 0.73 mg/dL (ref 0.44–1.00)
GFR, Estimated: >60 mL/min (ref >60)
Glucose, Bld: 85 mg/dL (ref 70–99)
Potassium: 4.1 mmol/L (ref 3.5–5.1)
Sodium: 139 mmol/L (ref 135–145)
Total Bilirubin: 0.3 mg/dL (ref 0.3–1.2)
Total Protein: 7.9 g/dL (ref 6.5–8.1)

## 2021-05-05 LAB — TROPONIN I (HIGH SENSITIVITY): Troponin I (High Sensitivity): 3 ng/L (ref <18)

## 2021-05-05 LAB — CBC
HCT: 40.1 % (ref 36.0–46.0)
Hemoglobin: 12.3 g/dL (ref 12.0–15.0)
MCH: 26.1 pg (ref 26.0–34.0)
MCHC: 30.7 g/dL (ref 30.0–36.0)
MCV: 85 fL (ref 80.0–100.0)
Platelets: 426 10*3/uL — ABNORMAL HIGH (ref 150–400)
RBC: 4.72 MIL/uL (ref 3.87–5.11)
RDW: 14.1 % (ref 11.5–15.5)
WBC: 12.3 10*3/uL — ABNORMAL HIGH (ref 4.0–10.5)
nRBC: 0 % (ref 0.0–0.2)

## 2021-05-05 LAB — D-DIMER, QUANTITATIVE: D-Dimer, Quant: <0.27 ug/mL-FEU (ref 0.00–0.50)

## 2021-05-05 LAB — LIPASE, BLOOD: Lipase: 44 U/L (ref 11–51)

## 2021-05-05 NOTE — ED Provider Notes (Signed)
? ?[facility] ?Provider Note ? ? ? Event Date/Time  ? First MD Initiated Contact with Patient 05/05/21 2151   ?  (approximate) ? ? ?History  ? ?Chest Pain and Shortness of Breath ? ? ?HPI ? ?Alison Mathews is a 36 y.o. female with past medical history of anxiety and gallstones who presents with chest pain and shortness of breath.  Symptoms started about 3 to 4 days ago.  Pain is located in the right center of her chest.  It is fairly constant nonexertional nonpleuritic.  She has some associated dyspnea no cough or hemoptysis.  Denies fevers chills no nausea vomiting.  She also endorses some pain in the left upper abdomen that has been starting around the same time.  She denies urinary symptoms fevers chills. The patient denies hx of prior DVT/PE, unilateral leg pain/swelling, hormone use, recent surgery, hx of cancer, prolonged immobilization, or hemoptysis.  ? ?  ? ?Past Medical History:  ?Diagnosis Date  ? Anxiety   ? Anxiety   ? Gall stones   ? ? ?Patient Active Problem List  ? Diagnosis Date Noted  ? Gastroenteritis 05/26/2019  ? Anxiety   ? Sinus tachycardia 03/11/2018  ? ? ? ?Physical Exam  ?Triage Vital Signs: ?ED Triage Vitals  ?Enc Vitals Group  ?   BP 05/05/21 2133 124/90  ?   Pulse Rate 05/05/21 2133 96  ?   Resp 05/05/21 2133 20  ?   Temp 05/05/21 2133 (!) 97.5 ?F (36.4 ?C)  ?   Temp Source 05/05/21 2133 Oral  ?   SpO2 05/05/21 2133 100 %  ?   Weight 05/05/21 2137 171 lb 15.3 oz (78 kg)  ?   Height 05/05/21 2137 5\' 3"  (1.6 m)  ?   Head Circumference --   ?   Peak Flow --   ?   Pain Score 05/05/21 2134 7  ?   Pain Loc --   ?   Pain Edu? --   ?   Excl. in [place]? --   ? ? ?Most recent vital signs: ?Vitals:  ? 05/05/21 2315 05/05/21 2330  ?BP: 107/78 103/72  ?Pulse:    ?Resp: 12 16  ?Temp:    ?SpO2:    ? ? ? ?General: Awake, no distress.  ?CV:  Good peripheral perfusion.  ?Resp:  Normal effort.  ?Abd:  No distention.  Soft, minimal tenderness in the epigastric and left upper  quadrant ?Neuro:             Awake, Alert, Oriented x 3  ?Other:  No lower extremity edema ? ? ?ED Results / Procedures / Treatments  ?Labs ?(all labs ordered are listed, but only abnormal results are displayed) ?Labs Reviewed  ?CBC - Abnormal; Notable for the following components:  ?    Result Value  ? WBC 12.3 (*)   ? Platelets 426 (*)   ? All other components within normal limits  ?COMPREHENSIVE METABOLIC PANEL  ?LIPASE, BLOOD  ?D-DIMER, QUANTITATIVE  ?URINALYSIS, COMPLETE (UACMP) WITH MICROSCOPIC  ?POC URINE PREG, ED  ?POC URINE PREG, ED  ?TROPONIN I (HIGH SENSITIVITY)  ?TROPONIN I (HIGH SENSITIVITY)  ? ? ? ?EKG ? ?EKG interpreted by myself, sinus tachycardia, minimal ST depression in lead II may be due to baseline wander, normal axis normal intervals ? ? ?RADIOLOGY ?EKG interpretation performed by myself: NSR, nml axis, nml intervals, no acute ischemic changes ? ? ? ?PROCEDURES: ? ?Critical Care performed: No ? ?Procedures ? ?  The patient is on the cardiac monitor to evaluate for evidence of arrhythmia and/or significant heart rate changes. ? ? ?MEDICATIONS ORDERED IN ED: ?Medications - No data to display ? ? ?IMPRESSION / MDM / ASSESSMENT AND PLAN / ED COURSE  ?I reviewed the triage vital signs and the nursing notes. ?             ?               ? ?Differential diagnosis includes, but is not limited to, pleurisy, pneumonia, musculoskeletal, biliary colic, pancreatitis, pulmonary embolism, less likely ACS ? ?Patient is a 36 year old female who presents primarily with chest pain.  This is been going on for several days substernal nonexertional nonpleuritic.  Is rather constant.  She does have some associated dyspnea without cough hemoptysis.  No PE risk factors.  Heart rate is above 100 so she does not PERC out.  She also complains of some left-sided upper abdominal pain.  On exam abdomen overall is benign she is somewhat tender in the epigastrium and left upper quadrant.  Plan to obtain a CBC CMP troponin and  D-dimer.  D-dimer will further restratify for PE.  Reviewed her EKG there is isolated ST depression in lead II but no other ischemic changes.  Will obtain a right upper quadrant ultrasound as well given the abdominal pain.  Will need a point-of-care hCG as well as urinalysis as well. ? ?CBC notable for mild leukocytosis to 12.3, CMP and lipase unremarkable.  Troponin is negative and D-dimer is also negative.  Reviewed her x-ray which does not show any infiltrate.  At the time of signout she is pending the right upper quadrant ultrasound and urine testing and reassessment.  Given her abdominal exam did not feel that cross-sectional imaging was necessary. ? ?Clinical Course as of 05/05/21 2352  ?Tue May 05, 2021  ?2246 D-Dimer, Quant: <0.27 [KM]  ?  ?Clinical Course User Index ?[KM] Georga Hacking, MD  ? ? ? ?FINAL CLINICAL IMPRESSION(S) / ED DIAGNOSES  ? ?Final diagnoses:  ?RUQ pain  ?Chest pain, unspecified type  ? ? ? ?Rx / DC Orders  ? ?ED Discharge Orders   ? ? None  ? ?  ? ? ? ?Note:  This document was prepared using Dragon voice recognition software and may include unintentional dictation errors. ?  ?Georga Hacking, MD ?05/05/21 2352 ? ?

## 2021-05-05 NOTE — ED Notes (Signed)
Lavender, light green, blue, red top tubes sent to lab. ?

## 2021-05-05 NOTE — ED Notes (Signed)
This RN notified Dr. Starleen Blue in person of pts 2 low BP readings. This RN & MD to bedside to assess pt. Repeat BP 102/81. ?

## 2021-05-05 NOTE — ED Notes (Signed)
Pt transported to xray via stretcher with xray tech. °

## 2021-05-05 NOTE — ED Triage Notes (Signed)
Pt presents via POV with complaints of chest pain with associated SOB starting two days ago that has progressively gotten worse. She describes the pain as sharp/heavy pressure to the middle of her chest without radiation. Respirations are equal and unlabored. No other concerns at this time.  ? ? ? ?

## 2021-05-05 NOTE — ED Notes (Signed)
Per CNA, EKG complete. ?

## 2021-05-06 LAB — POC URINE PREG, ED: Preg Test, Ur: NEGATIVE

## 2021-05-06 LAB — URINALYSIS, COMPLETE (UACMP) WITH MICROSCOPIC
Bacteria, UA: NONE SEEN
Bilirubin Urine: NEGATIVE
Glucose, UA: NEGATIVE mg/dL
Hgb urine dipstick: NEGATIVE
Ketones, ur: NEGATIVE mg/dL
Nitrite: NEGATIVE
Protein, ur: 100 mg/dL — AB
Specific Gravity, Urine: 1.038 — ABNORMAL HIGH (ref 1.005–1.030)
WBC, UA: >50 WBC/hpf — ABNORMAL HIGH (ref 0–5)
pH: 5 (ref 5.0–8.0)

## 2021-05-06 MED ORDER — KETOROLAC TROMETHAMINE 30 MG/ML IJ SOLN
15.0000 mg | Freq: Once | INTRAMUSCULAR | Status: AC
  Administered 2021-05-06: 15 mg via INTRAVENOUS
  Filled 2021-05-06: qty 1

## 2021-05-06 MED ORDER — SODIUM CHLORIDE 0.9 % IV BOLUS
1000.0000 mL | Freq: Once | INTRAVENOUS | Status: AC
  Administered 2021-05-06: 1000 mL via INTRAVENOUS

## 2021-05-06 NOTE — ED Provider Notes (Signed)
Accepted care of this patient from  Dr. Starleen Blue pending UA, Upreg, and RUQ [place].  UA with moderate leuks and greater than 50 WBCs but no nitrites and bacteria.  Patient has no symptoms of a UTI.  Urine has been sent for culture.  No antibiotics at this time.  Pregnancy test negative.  Right upper quadrant ultrasound is negative.  Patient's symptoms have resolved during my evaluation.  I discussed the results with her and recommended follow-up with her primary care doctor.  No indication for admission since symptoms have resolved and work-up is essentially unremarkable.  Recommended returning to the hospital if symptoms recur. ? ? ? ?I, Rudene Re, attending MD, have personally viewed and interpreted the images obtained during this visit as below: ? ?Ultrasound negative for gallstones ? ? ?___________________________________________________ ?Interpretation by Radiologist:  ?DG Chest 2 View ? ?Result Date: 05/05/2021 ?CLINICAL DATA:  Chest pain. EXAM: CHEST - 2 VIEW COMPARISON:  Chest radiograph dated 10/27/2019. FINDINGS: The lungs are clear. There is no pleural effusion pneumothorax. The cardiac silhouette is within normal limits. No acute osseous pathology. IMPRESSION: No active cardiopulmonary disease. Electronically Signed   By: Anner Crete M.D.   On: 05/05/2021 22:12  ? ?US Abdomen Limited RUQ (LIVER/GB) ? ?Result Date: 05/06/2021 ?CLINICAL DATA:  Right upper quadrant abdominal pain. EXAM: ULTRASOUND ABDOMEN LIMITED RIGHT UPPER QUADRANT COMPARISON:  Ultrasound dated 05/26/2019. FINDINGS: Gallbladder: No gallstones or wall thickening visualized. No sonographic Murphy sign noted by sonographer. Common bile duct: Diameter: 3 mm Liver: No focal lesion identified. Within normal limits in parenchymal echogenicity. Portal vein is patent on color Doppler imaging with normal direction of blood flow towards the liver. Other: None. IMPRESSION: Unremarkable right upper quadrant ultrasound. Electronically Signed   By:  Anner Crete M.D.   On: 05/06/2021 00:31   ? ?  ?Rudene Re, MD ?05/06/21 [id number] ? ?

## 2021-05-07 LAB — URINE CULTURE

## 2022-01-22 DIAGNOSIS — Z419 Encounter for procedure for purposes other than remedying health state, unspecified: Secondary | ICD-10-CM | POA: Diagnosis not present

## 2022-02-22 DIAGNOSIS — Z419 Encounter for procedure for purposes other than remedying health state, unspecified: Secondary | ICD-10-CM | POA: Diagnosis not present

## 2022-03-23 IMAGING — CT CT ABD-PELV W/ CM
2 of 4 series · 16 of 46 positions shown, 18 images · IV contrast (APPLIED)
Comparison: None.

CLINICAL DATA: Right-sided abdominal pain

EXAM:
CT ABDOMEN AND PELVIS WITH CONTRAST
TECHNIQUE: Multidetector CT imaging of the abdomen and pelvis was performed
using the standard protocol following bolus administration of
intravenous contrast.
CONTRAST:  80mL OMNIPAQUE IOHEXOL 350 MG/ML SOLN

[Series 2: routine abd/pel with · axial · 0.86mm/px · z∈[-1067,-612]mm · 13 of 101 slices shown, 15 images]
[im 5/101  soft-tissue]
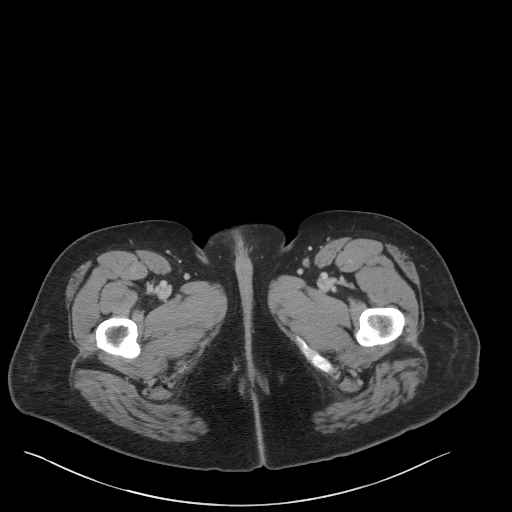
[im 5/101  bone]
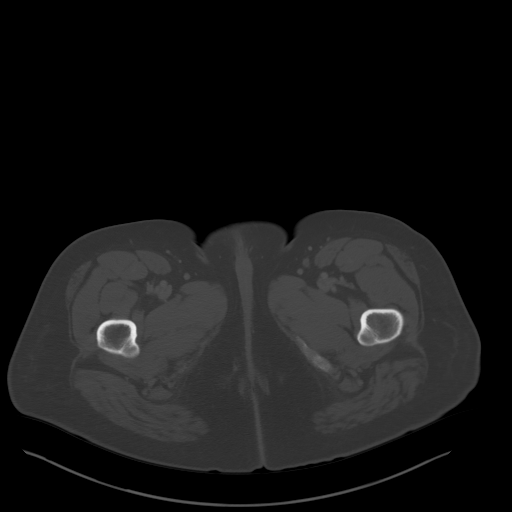
[im 14/101  soft-tissue]
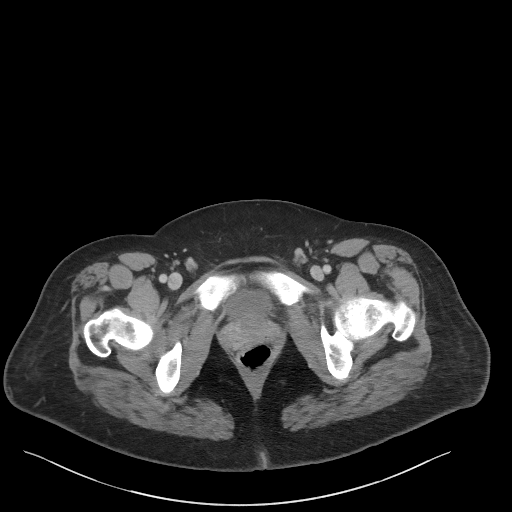
[im 22/101  soft-tissue]
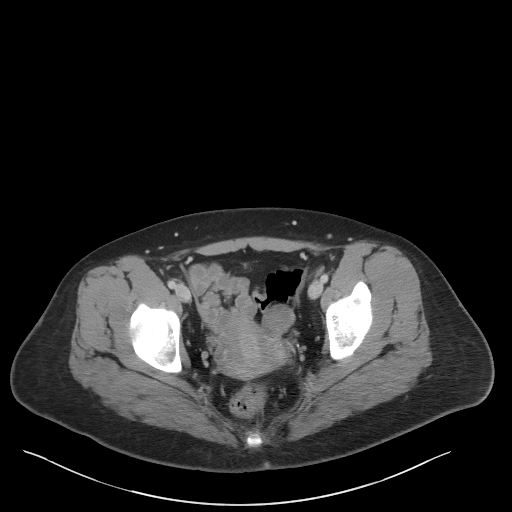
[im 27/101  soft-tissue]
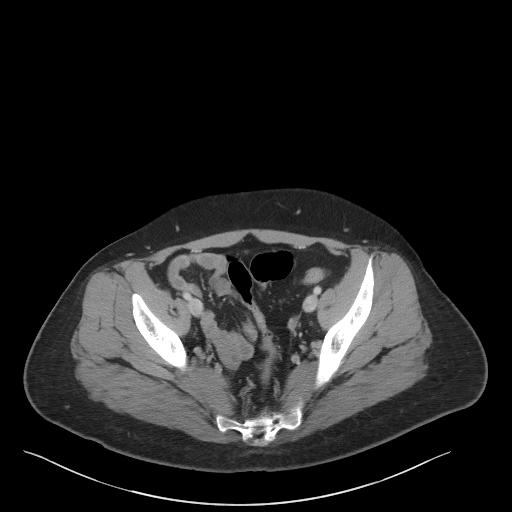
[im 35/101  soft-tissue]
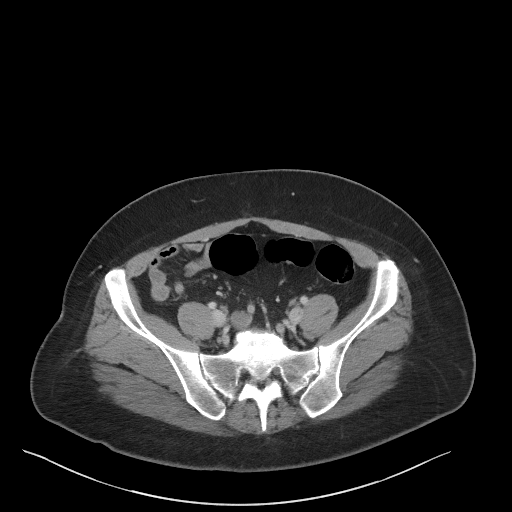
[im 44/101  soft-tissue]
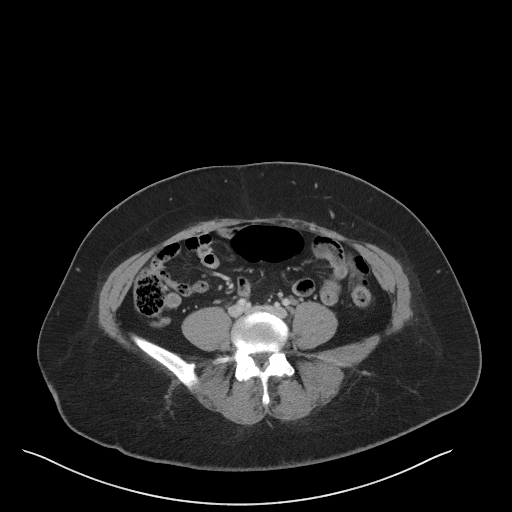
[im 53/101  soft-tissue]
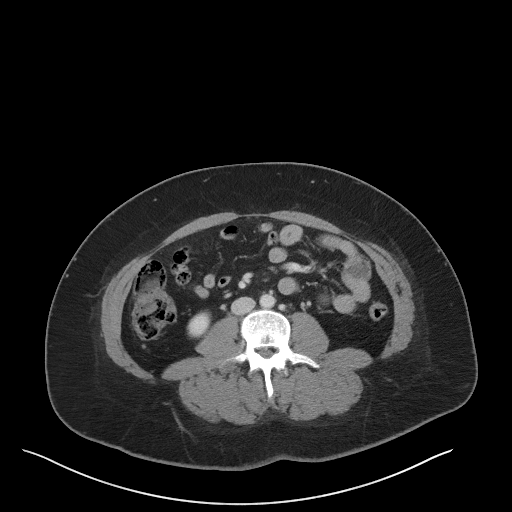
[im 57/101  soft-tissue]
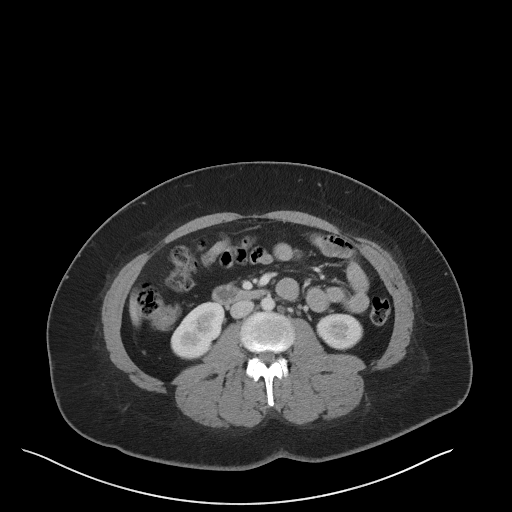
[im 66/101  soft-tissue]
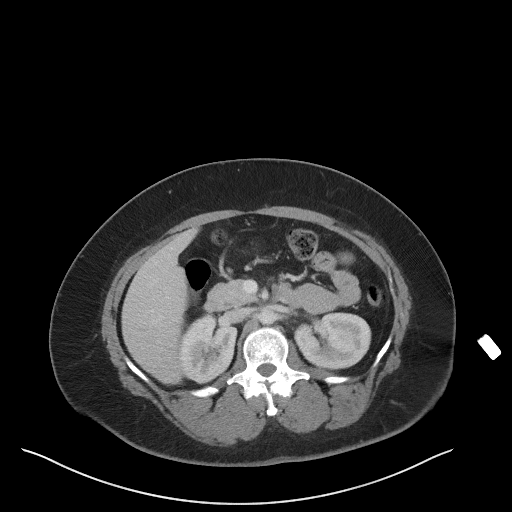
[im 66/101  bone]
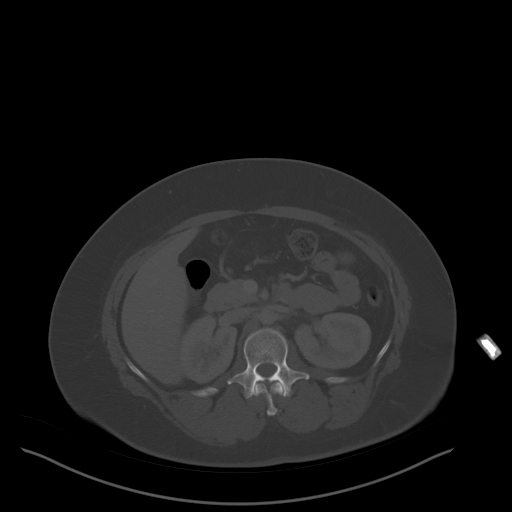
[im 74/101  soft-tissue]
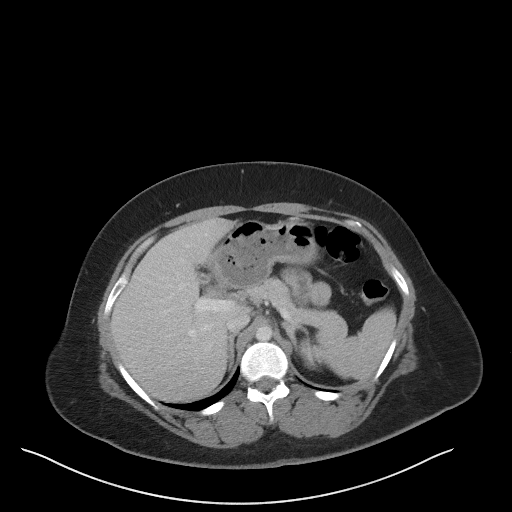
[im 79/101  soft-tissue]
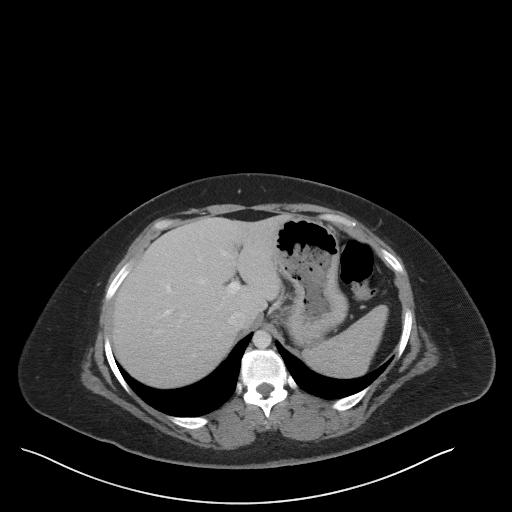
[im 87/101  soft-tissue]
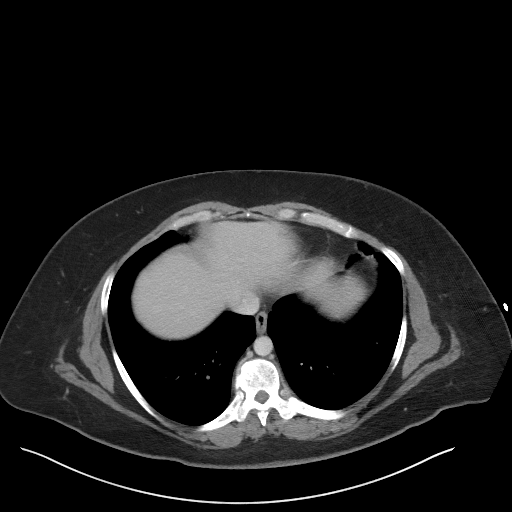
[im 96/101  soft-tissue]
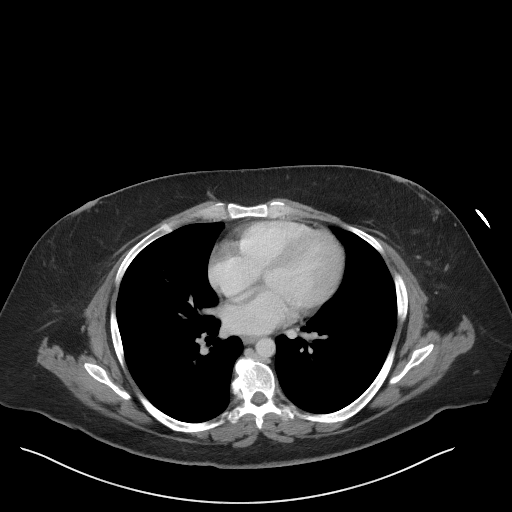

[Series 4: coronal st · coronal · 0.78mm/px · 3 of 92 slices shown]
[im 31/92  soft-tissue]
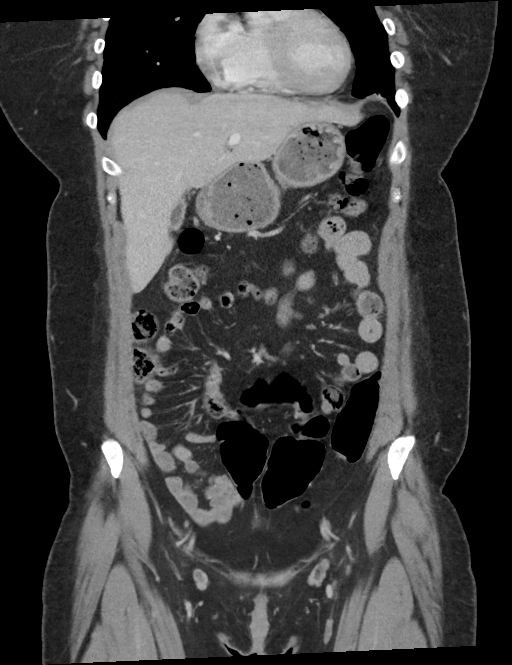
[im 41/92  soft-tissue]
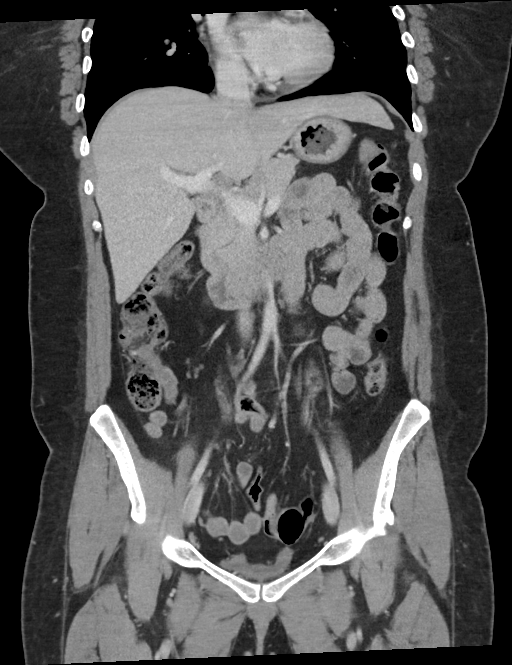
[im 51/92  soft-tissue]
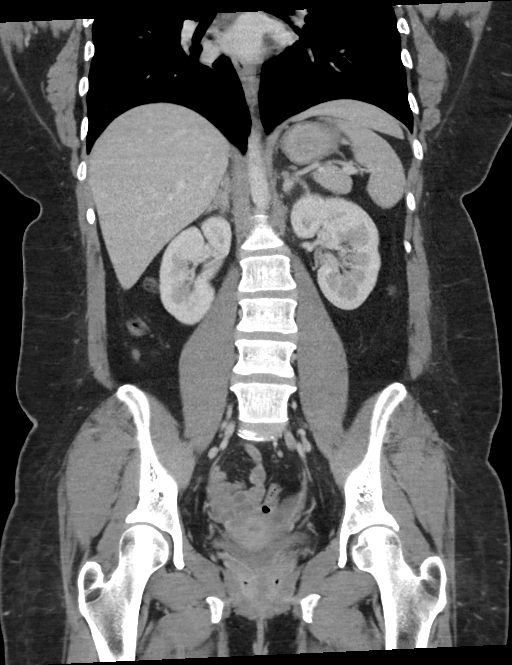

[16 of 46 positions shown; findings below may reference images not displayed]

FINDINGS: LOWER CHEST: Normal.

HEPATOBILIARY: Normal hepatic contours. No intra- or extrahepatic
biliary dilatation. The gallbladder is normal.

PANCREAS: Normal pancreas. No ductal dilatation or peripancreatic
fluid collection.

SPLEEN: Normal.

ADRENALS/URINARY TRACT: The adrenal glands are normal. No
hydronephrosis, nephroureterolithiasis or solid renal mass. The
urinary bladder is normal for degree of distention

STOMACH/BOWEL: There is no hiatal hernia. Normal duodenal course and
caliber. No small bowel dilatation or inflammation. No focal colonic
abnormality. Normal appendix.

VASCULAR/LYMPHATIC: Normal course and caliber of the major abdominal
vessels. No abdominal or pelvic lymphadenopathy.

REPRODUCTIVE: Normal uterus. No adnexal mass.

MUSCULOSKELETAL. No bony spinal canal stenosis or focal osseous
abnormality.

OTHER: None.
IMPRESSION: No acute abnormality of the abdomen or pelvis.

## 2022-03-25 DIAGNOSIS — Z419 Encounter for procedure for purposes other than remedying health state, unspecified: Secondary | ICD-10-CM | POA: Diagnosis not present

## 2022-04-23 DIAGNOSIS — Z419 Encounter for procedure for purposes other than remedying health state, unspecified: Secondary | ICD-10-CM | POA: Diagnosis not present

## 2022-05-24 DIAGNOSIS — Z419 Encounter for procedure for purposes other than remedying health state, unspecified: Secondary | ICD-10-CM | POA: Diagnosis not present

## 2022-06-22 ENCOUNTER — Telehealth: Payer: Self-pay

## 2022-06-22 NOTE — Telephone Encounter (Signed)
LVM for patient to call back. AS, CMA 

## 2022-06-23 DIAGNOSIS — Z419 Encounter for procedure for purposes other than remedying health state, unspecified: Secondary | ICD-10-CM | POA: Diagnosis not present

## 2022-07-24 DIAGNOSIS — Z419 Encounter for procedure for purposes other than remedying health state, unspecified: Secondary | ICD-10-CM | POA: Diagnosis not present

## 2022-08-23 DIAGNOSIS — Z419 Encounter for procedure for purposes other than remedying health state, unspecified: Secondary | ICD-10-CM | POA: Diagnosis not present

## 2022-08-24 IMAGING — CR DG CHEST 2V
1 series · 2 of 2 positions shown · non-contrast
Comparison: Chest radiograph dated 10/27/2019.

CLINICAL DATA: Chest pain.

EXAM:
CHEST - 2 VIEW

[Series 1: dg chest 2 view · 0.14mm/px · 2 of 2 slices shown]
[im 1/2]
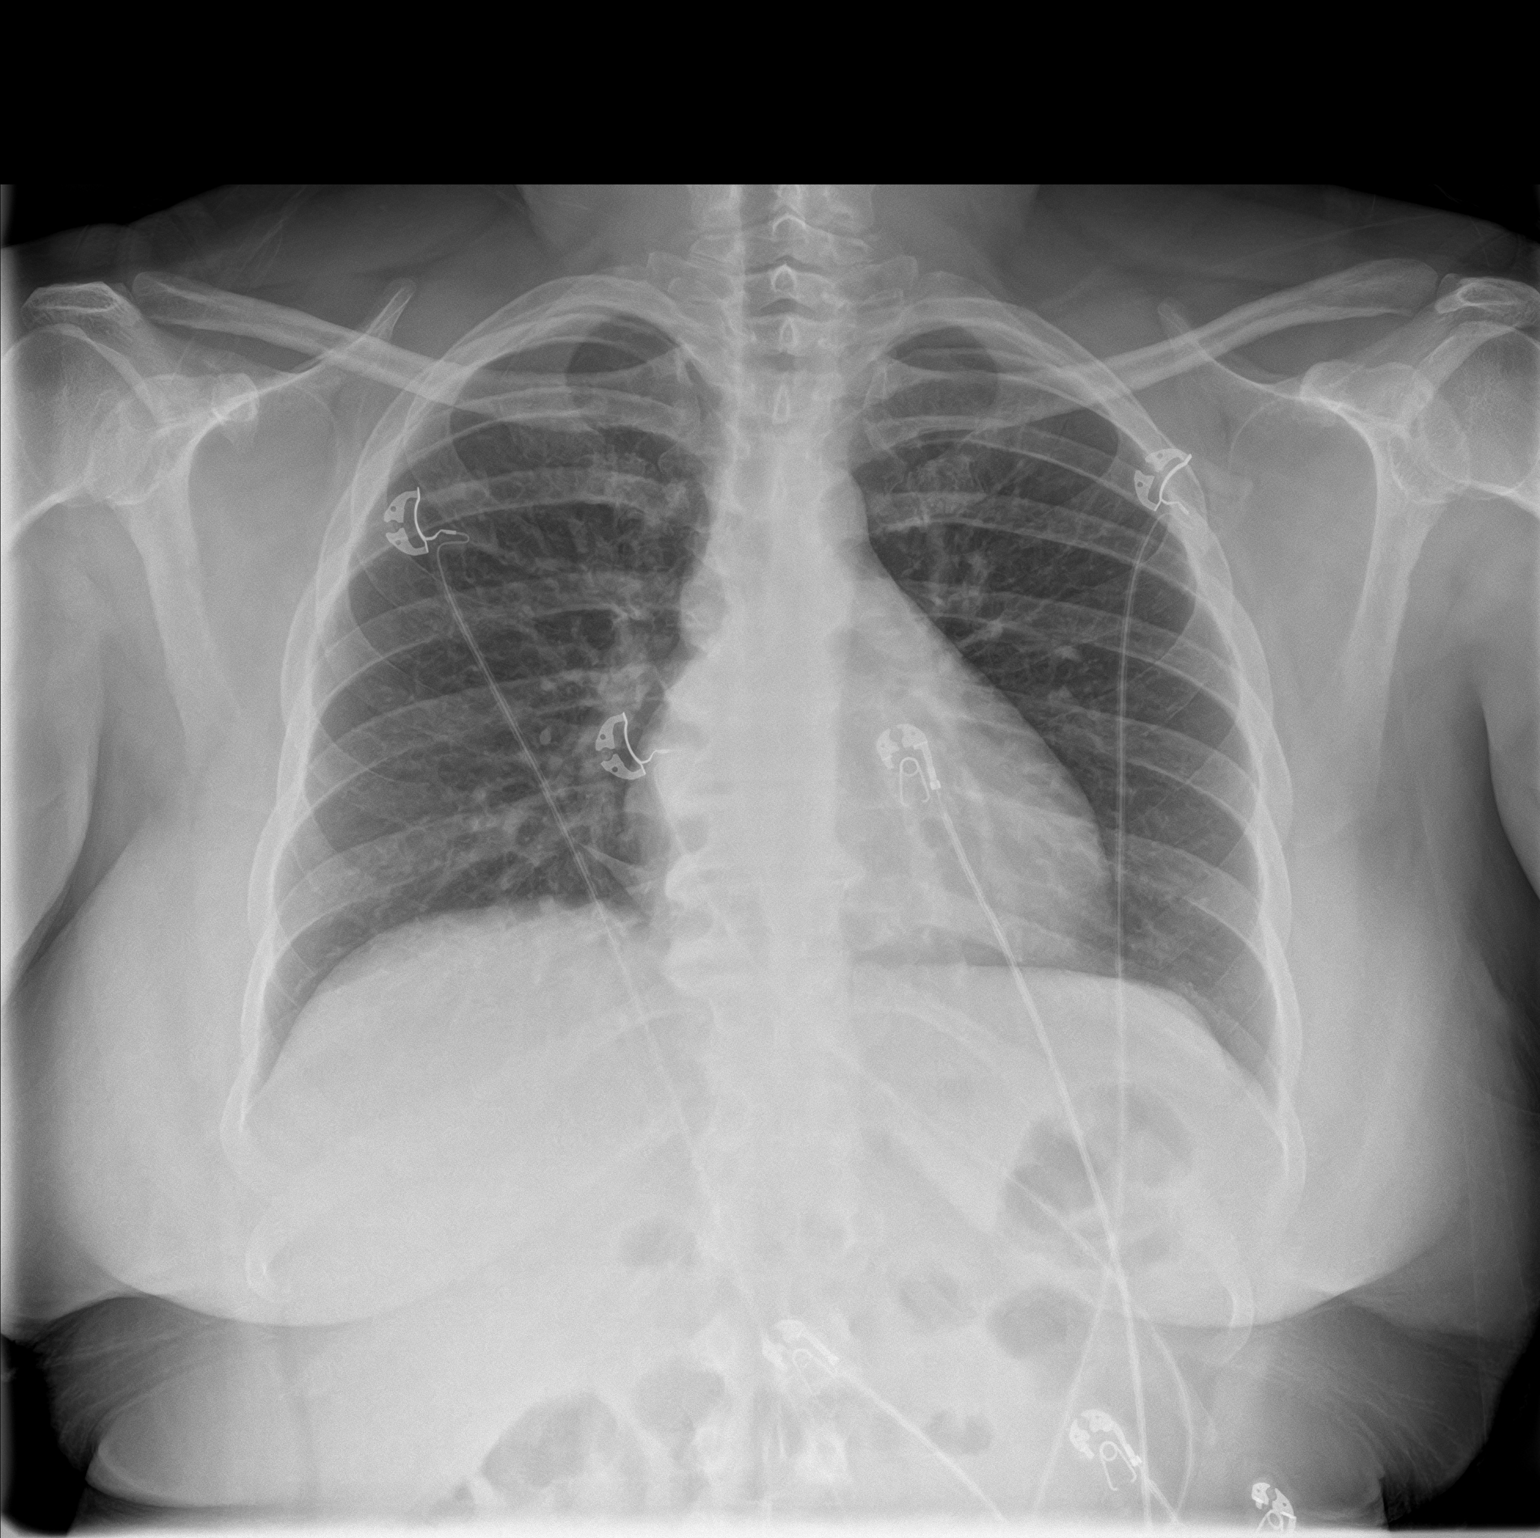
[im 2/2]
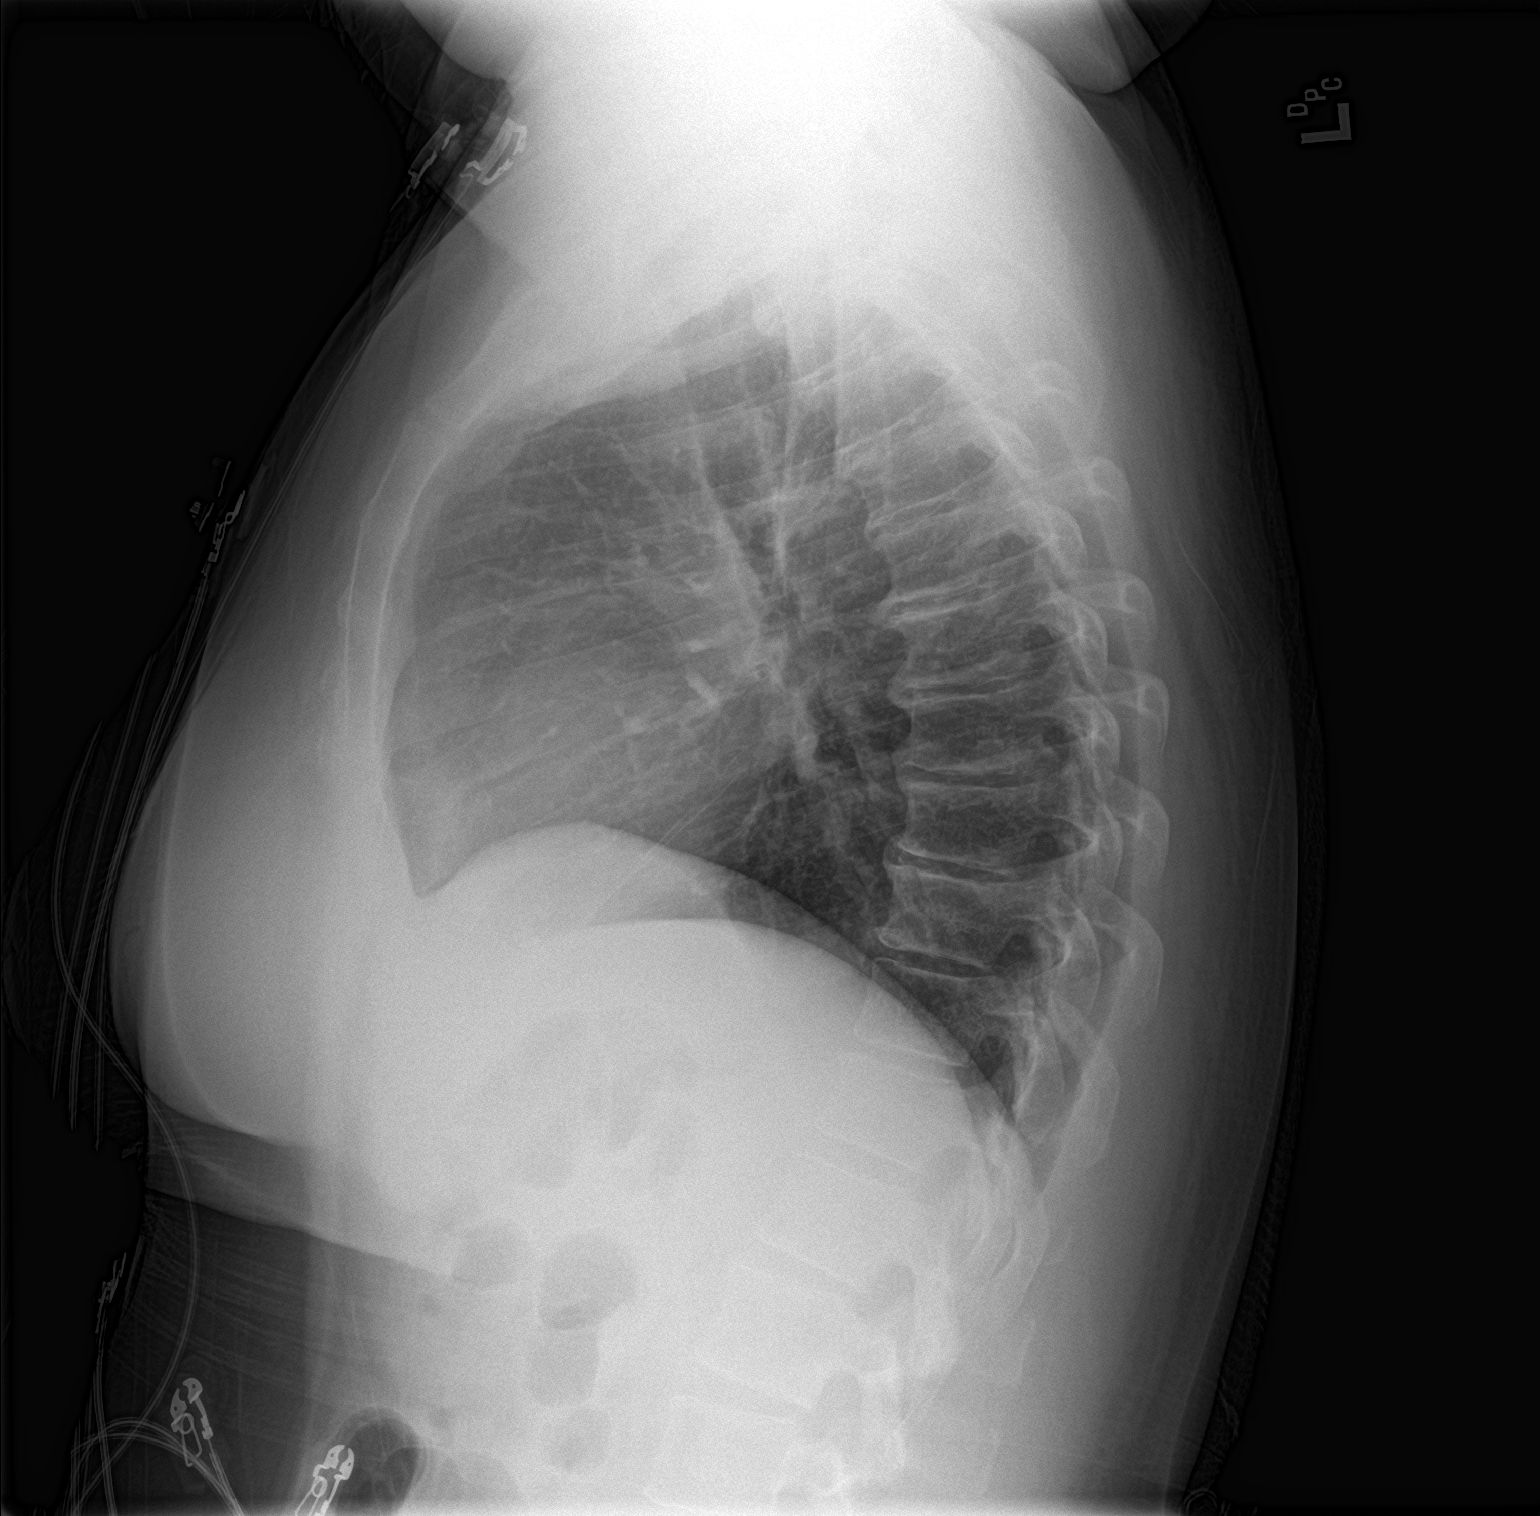

[2 of 2 positions shown; findings below may reference images not displayed]

FINDINGS: The lungs are clear. There is no pleural effusion pneumothorax. The
cardiac silhouette is within normal limits. No acute osseous
pathology.
IMPRESSION: No active cardiopulmonary disease.

## 2022-09-23 DIAGNOSIS — Z419 Encounter for procedure for purposes other than remedying health state, unspecified: Secondary | ICD-10-CM | POA: Diagnosis not present

## 2022-10-24 DIAGNOSIS — Z419 Encounter for procedure for purposes other than remedying health state, unspecified: Secondary | ICD-10-CM | POA: Diagnosis not present

## 2022-11-23 DIAGNOSIS — Z419 Encounter for procedure for purposes other than remedying health state, unspecified: Secondary | ICD-10-CM | POA: Diagnosis not present

## 2022-12-24 DIAGNOSIS — Z419 Encounter for procedure for purposes other than remedying health state, unspecified: Secondary | ICD-10-CM | POA: Diagnosis not present

## 2023-01-23 DIAGNOSIS — Z419 Encounter for procedure for purposes other than remedying health state, unspecified: Secondary | ICD-10-CM | POA: Diagnosis not present

## 2023-02-23 DIAGNOSIS — Z419 Encounter for procedure for purposes other than remedying health state, unspecified: Secondary | ICD-10-CM | POA: Diagnosis not present

## 2023-03-26 DIAGNOSIS — Z419 Encounter for procedure for purposes other than remedying health state, unspecified: Secondary | ICD-10-CM | POA: Diagnosis not present

## 2023-04-23 DIAGNOSIS — Z419 Encounter for procedure for purposes other than remedying health state, unspecified: Secondary | ICD-10-CM | POA: Diagnosis not present

## 2023-06-04 DIAGNOSIS — Z419 Encounter for procedure for purposes other than remedying health state, unspecified: Secondary | ICD-10-CM | POA: Diagnosis not present

## 2023-07-01 ENCOUNTER — Encounter: Payer: Self-pay | Admitting: Emergency Medicine

## 2023-07-01 ENCOUNTER — Other Ambulatory Visit: Payer: Self-pay

## 2023-07-01 DIAGNOSIS — K122 Cellulitis and abscess of mouth: Secondary | ICD-10-CM | POA: Diagnosis not present

## 2023-07-01 DIAGNOSIS — E669 Obesity, unspecified: Secondary | ICD-10-CM | POA: Diagnosis not present

## 2023-07-01 DIAGNOSIS — Z6830 Body mass index (BMI) 30.0-30.9, adult: Secondary | ICD-10-CM | POA: Diagnosis not present

## 2023-07-01 DIAGNOSIS — K029 Dental caries, unspecified: Secondary | ICD-10-CM | POA: Diagnosis not present

## 2023-07-01 DIAGNOSIS — L03211 Cellulitis of face: Secondary | ICD-10-CM | POA: Diagnosis not present

## 2023-07-01 DIAGNOSIS — R220 Localized swelling, mass and lump, head: Secondary | ICD-10-CM | POA: Diagnosis present

## 2023-07-01 LAB — CBC WITH DIFFERENTIAL/PLATELET
Abs Immature Granulocytes: 0.04 10*3/uL (ref 0.00–0.07)
Basophils Absolute: 0.1 10*3/uL (ref 0.0–0.1)
Basophils Relative: 1 %
Eosinophils Absolute: 0.7 10*3/uL — ABNORMAL HIGH (ref 0.0–0.5)
Eosinophils Relative: 6 %
HCT: 40.7 % (ref 36.0–46.0)
Hemoglobin: 12.5 g/dL (ref 12.0–15.0)
Immature Granulocytes: 0 %
Lymphocytes Relative: 18 %
Lymphs Abs: 2 10*3/uL (ref 0.7–4.0)
MCH: 26.4 pg (ref 26.0–34.0)
MCHC: 30.7 g/dL (ref 30.0–36.0)
MCV: 85.9 fL (ref 80.0–100.0)
Monocytes Absolute: 0.8 10*3/uL (ref 0.1–1.0)
Monocytes Relative: 7 %
Neutro Abs: 8 10*3/uL — ABNORMAL HIGH (ref 1.7–7.7)
Neutrophils Relative %: 68 %
Platelets: 374 10*3/uL (ref 150–400)
RBC: 4.74 MIL/uL (ref 3.87–5.11)
RDW: 13.4 % (ref 11.5–15.5)
WBC: 11.5 10*3/uL — ABNORMAL HIGH (ref 4.0–10.5)
nRBC: 0 % (ref 0.0–0.2)

## 2023-07-01 LAB — COMPREHENSIVE METABOLIC PANEL WITH GFR
ALT: 30 U/L (ref 0–44)
AST: 40 U/L (ref 15–41)
Albumin: 4.5 g/dL (ref 3.5–5.0)
Alkaline Phosphatase: 57 U/L (ref 38–126)
Anion gap: 8 (ref 5–15)
BUN: 9 mg/dL (ref 6–20)
CO2: 29 mmol/L (ref 22–32)
Calcium: 9.2 mg/dL (ref 8.9–10.3)
Chloride: 101 mmol/L (ref 98–111)
Creatinine, Ser: 0.58 mg/dL (ref 0.44–1.00)
GFR, Estimated: >60 mL/min (ref >60)
Glucose, Bld: 106 mg/dL — ABNORMAL HIGH (ref 70–99)
Potassium: 3.6 mmol/L (ref 3.5–5.1)
Sodium: 138 mmol/L (ref 135–145)
Total Bilirubin: 0.7 mg/dL (ref 0.0–1.2)
Total Protein: 8.6 g/dL — ABNORMAL HIGH (ref 6.5–8.1)

## 2023-07-01 NOTE — ED Triage Notes (Signed)
 Pt in with swelling, warmth and redness to R cheek and mouth, worsening x 3 days. Pt does report dental caries to R mouth and mentions she has had a stray cat living with her recently, no known scratch marks. Denies any sob or throat tightening, or swelling into neck, no itching or hives reported.

## 2023-07-02 ENCOUNTER — Emergency Department

## 2023-07-02 ENCOUNTER — Observation Stay
Admission: EM | Admit: 2023-07-02 | Discharge: 2023-07-03 | Disposition: A | Discharge status: Home / Self Care | Attending: Obstetrics and Gynecology | Admitting: Obstetrics and Gynecology

## 2023-07-02 ENCOUNTER — Encounter: Payer: Self-pay | Admitting: Internal Medicine

## 2023-07-02 DIAGNOSIS — L03211 Cellulitis of face: Secondary | ICD-10-CM | POA: Diagnosis not present

## 2023-07-02 DIAGNOSIS — K029 Dental caries, unspecified: Secondary | ICD-10-CM | POA: Diagnosis not present

## 2023-07-02 DIAGNOSIS — K122 Cellulitis and abscess of mouth: Secondary | ICD-10-CM | POA: Diagnosis not present

## 2023-07-02 DIAGNOSIS — E669 Obesity, unspecified: Secondary | ICD-10-CM | POA: Diagnosis not present

## 2023-07-02 LAB — HIV ANTIBODY (ROUTINE TESTING W REFLEX): HIV Screen 4th Generation wRfx: NONREACTIVE

## 2023-07-02 LAB — LACTIC ACID, PLASMA
Lactic Acid, Venous: 0.8 mmol/L (ref 0.5–1.9)
Lactic Acid, Venous: 2.2 mmol/L (ref 0.5–1.9)

## 2023-07-02 LAB — MRSA NEXT GEN BY PCR, NASAL: MRSA by PCR Next Gen: NOT DETECTED

## 2023-07-02 LAB — HCG, QUANTITATIVE, PREGNANCY: hCG, Beta Chain, Quant, S: <1 m[IU]/mL (ref <5)

## 2023-07-02 MED ORDER — LINEZOLID 600 MG/300ML IV SOLN
600.0000 mg | Freq: Two times a day (BID) | INTRAVENOUS | Status: DC
  Administered 2023-07-02 – 2023-07-03 (×3): 600 mg via INTRAVENOUS
  Filled 2023-07-02 (×4): qty 300

## 2023-07-02 MED ORDER — KETOROLAC TROMETHAMINE 30 MG/ML IJ SOLN
30.0000 mg | Freq: Four times a day (QID) | INTRAMUSCULAR | Status: DC | PRN
  Administered 2023-07-02: 30 mg via INTRAVENOUS
  Filled 2023-07-02: qty 1

## 2023-07-02 MED ORDER — ONDANSETRON HCL 4 MG/2ML IJ SOLN: 4.0000 mg | Freq: Four times a day (QID) | INTRAMUSCULAR | Status: DC | PRN

## 2023-07-02 MED ORDER — VANCOMYCIN HCL 1750 MG/350ML IV SOLN
1750.0000 mg | Freq: Once | INTRAVENOUS | Status: AC
  Administered 2023-07-02: 1750 mg via INTRAVENOUS
  Filled 2023-07-02: qty 350

## 2023-07-02 MED ORDER — MORPHINE SULFATE (PF) 2 MG/ML IV SOLN
2.0000 mg | INTRAVENOUS | Status: DC | PRN
  Administered 2023-07-03: 2 mg via INTRAVENOUS
  Filled 2023-07-02: qty 1

## 2023-07-02 MED ORDER — ACETAMINOPHEN 325 MG RE SUPP: 650.0000 mg | Freq: Four times a day (QID) | RECTAL | Status: DC | PRN

## 2023-07-02 MED ORDER — ONDANSETRON HCL 4 MG/2ML IJ SOLN
4.0000 mg | Freq: Once | INTRAMUSCULAR | Status: AC
  Administered 2023-07-02: 4 mg via INTRAVENOUS
  Filled 2023-07-02: qty 2

## 2023-07-02 MED ORDER — DEXAMETHASONE SODIUM PHOSPHATE 10 MG/ML IJ SOLN
10.0000 mg | Freq: Once | INTRAMUSCULAR | Status: AC
  Administered 2023-07-02: 10 mg via INTRAVENOUS
  Filled 2023-07-02: qty 1

## 2023-07-02 MED ORDER — ACETAMINOPHEN 325 MG PO TABS: 650.0000 mg | ORAL_TABLET | Freq: Four times a day (QID) | ORAL | Status: DC | PRN

## 2023-07-02 MED ORDER — KETOROLAC TROMETHAMINE 30 MG/ML IJ SOLN
30.0000 mg | Freq: Once | INTRAMUSCULAR | Status: AC
  Administered 2023-07-02: 30 mg via INTRAVENOUS
  Filled 2023-07-02: qty 1

## 2023-07-02 MED ORDER — SODIUM CHLORIDE 0.9 % IV SOLN: INTRAVENOUS | Status: AC

## 2023-07-02 MED ORDER — ONDANSETRON HCL 4 MG PO TABS: 4.0000 mg | ORAL_TABLET | Freq: Four times a day (QID) | ORAL | Status: DC | PRN

## 2023-07-02 MED ORDER — ENOXAPARIN SODIUM 40 MG/0.4ML IJ SOSY
40.0000 mg | PREFILLED_SYRINGE | INTRAMUSCULAR | Status: DC
  Administered 2023-07-02: 40 mg via SUBCUTANEOUS
  Filled 2023-07-02: qty 0.4

## 2023-07-02 MED ORDER — SODIUM CHLORIDE 0.9 % IV BOLUS (SEPSIS)
1000.0000 mL | Freq: Once | INTRAVENOUS | Status: AC
Start: 2023-07-02 — End: 2023-07-02
  Administered 2023-07-02: 1000 mL via INTRAVENOUS

## 2023-07-02 MED ORDER — METRONIDAZOLE 500 MG/100ML IV SOLN
500.0000 mg | Freq: Once | INTRAVENOUS | Status: AC
  Administered 2023-07-02: 500 mg via INTRAVENOUS
  Filled 2023-07-02: qty 100

## 2023-07-02 MED ORDER — HYDROCODONE-ACETAMINOPHEN 5-325 MG PO TABS
1.0000 | ORAL_TABLET | ORAL | Status: DC | PRN
  Administered 2023-07-02 (×2): 2 via ORAL
  Filled 2023-07-02 (×2): qty 2

## 2023-07-02 MED ORDER — SODIUM CHLORIDE 0.9 % IV BOLUS (SEPSIS)
1000.0000 mL | Freq: Once | INTRAVENOUS | Status: AC
  Administered 2023-07-02: 1000 mL via INTRAVENOUS

## 2023-07-02 MED ORDER — MORPHINE SULFATE (PF) 4 MG/ML IV SOLN
4.0000 mg | Freq: Once | INTRAVENOUS | Status: AC
  Administered 2023-07-02: 4 mg via INTRAVENOUS
  Filled 2023-07-02: qty 1

## 2023-07-02 MED ORDER — SODIUM CHLORIDE 0.9 % IV SOLN
2.0000 g | Freq: Once | INTRAVENOUS | Status: AC
  Administered 2023-07-02: 2 g via INTRAVENOUS
  Filled 2023-07-02: qty 20

## 2023-07-02 MED ORDER — IOHEXOL 300 MG/ML  SOLN
75.0000 mL | Freq: Once | INTRAMUSCULAR | Status: AC | PRN
  Administered 2023-07-02: 75 mL via INTRAVENOUS

## 2023-07-02 NOTE — Plan of Care (Signed)

## 2023-07-02 NOTE — ED Provider Notes (Addendum)
 [facility] Provider Note    Event Date/Time   First MD Initiated Contact with Patient 07/02/23 0017     (approximate)   History   Facial Swelling   HPI  Alison Mathews is a 38 y.o. female with history of anxiety who presents to the emergency department with complaints of right-sided facial swelling and pain.  States symptoms started yesterday and progressively worsened today.  No fevers.  Not a diabetic.  No vomiting.   History provided by patient, family.    Past Medical History:  Diagnosis Date   Anxiety    Anxiety    Gall stones     History reviewed. No pertinent surgical history.  MEDICATIONS:  Prior to Admission medications   Medication Sig Start Date End Date Taking? Authorizing Provider  clonazePAM  (KLONOPIN ) 0.5 MG tablet Take 1 tablet by mouth 2 (two) times daily as needed. 04/01/21   [provider]  gabapentin  (NEURONTIN ) 300 MG capsule Take 300 mg by mouth 3 (three) times daily. 04/29/21   [provider]  ondansetron  (ZOFRAN -ODT) 4 MG disintegrating tablet Take 1 tablet (4 mg total) by mouth every 8 (eight) hours as needed. 04/08/21   Arline Bennett, MD    Physical Exam   Triage Vital Signs: ED Triage Vitals  Encounter Vitals Group     BP 07/01/23 2028 (!) 145/90     Systolic BP Percentile --      Diastolic BP Percentile --      Pulse Rate 07/01/23 2028 (!) 110     Resp 07/01/23 2028 20     Temp 07/01/23 2028 98.4 F (36.9 C)     Temp Source 07/01/23 2028 Oral     SpO2 07/01/23 2028 97 %     Weight 07/01/23 2029 171 lb 15.3 oz (78 kg)     Height 07/02/23 0000 5\' 3"  (1.6 m)     Head Circumference --      Peak Flow --      Pain Score 07/01/23 2029 8     Pain Loc --      Pain Education --      Exclude from Growth Chart --     Most recent vital signs: Vitals:   07/02/23 0345 07/02/23 0400  BP:  107/61  Pulse: 99 (!) 101  Resp:  15  Temp:  97.6 F (36.4 C)  SpO2: 99% 97%    CONSTITUTIONAL: Alert,  responds appropriately to questions. Well-appearing; well-nourished HEAD: Normocephalic, atraumatic EYES: Conjunctivae clear, pupils appear equal, sclera nonicteric ENT: normal nose; moist mucous membranes patient has erythema and soft tissue swelling noted to the right side of her face with a 2 x 3 cm indurated area without fluctuance or drainage, multiple dental caries.  Patient does have trismus.  Airway patent.  No drooling.  Normal phonation.  No stridor.  Tongue sits flat in the bottom of the mouth. NECK: Supple, normal ROM, no swelling underneath the jaw, no cervical lymphadenopathy noted CARD: Regular and tachycardic; S1 and S2 appreciated RESP: Normal chest excursion without splinting or tachypnea; breath sounds clear and equal bilaterally; no wheezes, no rhonchi, no rales, no hypoxia or respiratory distress, speaking full sentences ABD/GI: Non-distended; soft, non-tender, no rebound, no guarding, no peritoneal signs BACK: The back appears normal EXT: Normal ROM in all joints; no deformity noted, no edema SKIN: Normal color for age and race; warm; no rash on exposed skin NEURO: Moves all extremities equally, normal speech PSYCH: The patient's mood  and manner are appropriate.    Patient gave verbal permission to utilize photo for medical documentation only. The image was not stored on any personal device.  ED Results / Procedures / Treatments   LABS: (all labs ordered are listed, but only abnormal results are displayed) Labs Reviewed  COMPREHENSIVE METABOLIC PANEL WITH GFR - Abnormal; Notable for the following components:      Result Value   Glucose, Bld 106 (*)    Total Protein 8.6 (*)    All other components within normal limits  CBC WITH DIFFERENTIAL/PLATELET - Abnormal; Notable for the following components:   WBC 11.5 (*)    Neutro Abs 8.0 (*)    Eosinophils Absolute 0.7 (*)    All other components within normal limits  CULTURE, BLOOD (ROUTINE X 2)  CULTURE, BLOOD  (ROUTINE X 2)  HCG, QUANTITATIVE, PREGNANCY  LACTIC ACID, PLASMA  LACTIC ACID, PLASMA     EKG:   RADIOLOGY: My personal review and interpretation of imaging:  facial cellulitis  I have personally reviewed all radiology reports.   CT Maxillofacial W Contrast Result Date: 07/02/2023 CLINICAL DATA:  Sublingual/submandibular facial abscess. EXAM: CT MAXILLOFACIAL WITH CONTRAST TECHNIQUE: Multidetector CT imaging of the maxillofacial structures was performed with intravenous contrast. Multiplanar CT image reconstructions were also generated. RADIATION DOSE REDUCTION: This exam was performed according to the departmental dose-optimization program which includes automated exposure control, adjustment of the mA and/or kV according to patient size and/or use of iterative reconstruction technique. CONTRAST:  75mL OMNIPAQUE  IOHEXOL  300 MG/ML  SOLN COMPARISON:  None Available. FINDINGS: Osseous: Multiple dental caries with periapical lucencies specifically around the lower left incisors and tooth 3, 10, and 15. There is cellulitis of the base of the upper lip centrally and towards the right, no abscess. No floor of mouth swelling. Orbits: Negative Sinuses: Generalized mucosal thickening paranasal sinuses without fluid level. Soft tissues: Cellulitis is noted above. Submental adenopathy considered reactive. Limited intracranial: Negative IMPRESSION: Cellulitis centered on the upper lip and right face, likely odontogenic given numerous dental caries and periapical erosions. No abscess or floor of mouth swelling. Electronically Signed   By: Ronnette Coke M.D.   On: 07/02/2023 04:07     PROCEDURES:  Critical Care performed: Yes, see critical care procedure note(s)   CRITICAL CARE Performed by: Starling Eck Moni Rothrock   Total critical care time: 30 minutes  Critical care time was exclusive of separately billable procedures and treating other patients.  Critical care was necessary to treat or prevent imminent  or life-threatening deterioration.  Critical care was time spent personally by me on the following activities: development of treatment plan with patient and/or surrogate as well as nursing, discussions with consultants, evaluation of patient's response to treatment, examination of patient, obtaining history from patient or surrogate, ordering and performing treatments and interventions, ordering and review of laboratory studies, ordering and review of radiographic studies, pulse oximetry and re-evaluation of patient's condition.   Aaron Aas1-3 Lead EKG Interpretation  Performed by: Addisyn Leclaire, Clover Dao, DO Authorized by: Izzac Rockett, Clover Dao, DO     Interpretation: abnormal     ECG rate:  113   ECG rate assessment: tachycardic     Rhythm: sinus tachycardia     Ectopy: none     Conduction: normal       IMPRESSION / MDM / ASSESSMENT AND PLAN / ED COURSE  I reviewed the triage vital signs and the nursing notes.    Patient here with facial cellulitis and likely underlying abscess.  The patient is on the cardiac monitor to evaluate for evidence of arrhythmia and/or significant heart rate changes.   DIFFERENTIAL DIAGNOSIS (includes but not limited to):   Facial cellulitis, abscess, odontogenic infection, doubt parotitis, sialoadenitis, no sign of Ludwig's angina   Patient's presentation is most consistent with acute presentation with potential threat to life or bodily function.   PLAN: Labs show leukocytosis of 11.5.  Normal creatinine, electrolytes.  Will obtain CT of the face.  Will give Rocephin  and vancomycin for broad coverage for cellulitis.  I suspect that she has a pimple or abscess that precipitated this facial cellulitis to the right cheek.  It seems to have started externally rather than being odontogenic in nature but will add on anaerobic coverage if CT shows odontogenic source.  Will give Decadron, Toradol  for pain control, facial swelling.  Airway currently patent.  She does have trismus  secondary to pain with opening the mouth but is managing her secretions without difficulty and her phonation is normal.   MEDICATIONS GIVEN IN ED: Medications  metroNIDAZOLE (FLAGYL) IVPB 500 mg (has no administration in time range)  morphine  (PF) 4 MG/ML injection 4 mg (has no administration in time range)  sodium chloride  0.9 % bolus 1,000 mL (has no administration in time range)  sodium chloride  0.9 % bolus 1,000 mL (0 mLs Intravenous Stopped 07/02/23 0245)  ketorolac  (TORADOL ) 30 MG/ML injection 30 mg (30 mg Intravenous Given 07/02/23 0120)  ondansetron  (ZOFRAN ) injection 4 mg (4 mg Intravenous Given 07/02/23 0123)  dexamethasone (DECADRON) injection 10 mg (10 mg Intravenous Given 07/02/23 0123)  cefTRIAXone  (ROCEPHIN ) 2 g in sodium chloride  0.9 % 100 mL IVPB (0 g Intravenous Stopped 07/02/23 0206)  vancomycin (VANCOREADY) IVPB 1750 mg/350 mL (0 mg Intravenous Stopped 07/02/23 0419)  iohexol  (OMNIPAQUE ) 300 MG/ML solution 75 mL (75 mLs Intravenous Contrast Given 07/02/23 0139)     ED COURSE: CT scan reviewed and interpreted by myself and the radiologist and shows no abscess.  They suspect that this is likely odontogenic in nature given dental caries.  Will give IV Flagyl.  She reports some improvement in pain and improvement in opening her mouth but given cellulitis with leukocytosis, tachycardia, will admit for IV antibiotics and further monitoring.  She is comfortable with this plan.   CONSULTS:  Consulted and discussed patient's case with hospitalist, Dr. Vallarie Gauze.  I have recommended admission and consulting physician agrees and will place admission orders.  Patient (and family if present) agree with this plan.   I reviewed all nursing notes, vitals, pertinent previous records.  All labs, EKGs, imaging ordered have been independently reviewed and interpreted by myself.    OUTSIDE RECORDS REVIEWED: Reviewed last internal medicine note in September 2023.       FINAL CLINICAL  IMPRESSION(S) / ED DIAGNOSES   Final diagnoses:  Facial cellulitis     Rx / DC Orders   ED Discharge Orders     None        Note:  This document was prepared using Dragon voice recognition software and may include unintentional dictation errors.   Alyss Granato, Clover Dao, DO 07/02/23 0424    Jameshia Hayashida, Clover Dao, DO 07/02/23 Lyna Sandhoff

## 2023-07-02 NOTE — Progress Notes (Signed)
 ED Pharmacy Antibiotic Sign Off An antibiotic consult was received from an ED provider for Vancomycin per pharmacy dosing for cellulits. A chart review was completed to assess appropriateness.   The following one time order(s) were placed:  Vancomycin 1750 mg IV X 1   Further antibiotic and/or antibiotic pharmacy consults should be ordered by the admitting provider if indicated.   Thank you for allowing pharmacy to be a part of this patient's care.   Alvenia Job, [facility], [place]  Clinical Pharmacist 07/02/23 1:10 AM

## 2023-07-02 NOTE — H&P (Addendum)
 History and Physical    Patient: Alison Mathews:[id number] DOB: [birth year] DOA: 07/02/2023 DOS: the patient was seen and examined on 07/02/2023 PCP: Edrick Gowda Jonni Nettle, MD  Patient coming from: Home  Chief Complaint:  Chief Complaint  Patient presents with   Facial Swelling   HPI: Alison Mathews is a 38 y.o. female with medical history significant of obesity anxiety presenting with facial cellulitis and odontogenic infection.  Patient reports worsening right sided facial pain and swelling over the past 24 hours.  General right cheek redness x 3 days.  Baseline history of dental caries.  Denies any significant trouble swallowing.  Positive pain with opening the jaw.  No shortness of breath.  Positive mild headache.  Denies any alcohol use.  No tobacco use.  Denies any prior episodes like this in the past. Presented to the ER afebrile, heart rate 100s, BP stable.  Satting well on room air.  White count 11.5, hemoglobin 12.5, platelet 374, creatinine 0.6. Cellulitis centered on the upper lip and right face, likely odontogenic given numerous dental caries and periapical erosions Review of Systems: As mentioned in the history of present illness. All other systems reviewed and are negative. Past Medical History:  Diagnosis Date   Anxiety    Anxiety    Gall stones    History reviewed. No pertinent surgical history. Social History:  reports that she has never smoked. She has never used smokeless tobacco. She reports that she does not drink alcohol and does not use drugs.  Allergies  Allergen Reactions   Motrin [Ibuprofen] Nausea And Vomiting    Family History  Problem Relation Age of Onset   Cervical cancer Mother    Hypertension Mother     Prior to Admission medications   Medication Sig Start Date End Date Taking? Authorizing Provider  clonazePAM  (KLONOPIN ) 0.5 MG tablet Take 1 tablet by mouth 2 (two) times daily as needed. Patient not taking: Reported on 07/02/2023 04/01/21    [provider]  gabapentin  (NEURONTIN ) 300 MG capsule Take 300 mg by mouth 3 (three) times daily. Patient not taking: Reported on 07/02/2023 04/29/21   [provider]  ondansetron  (ZOFRAN -ODT) 4 MG disintegrating tablet Take 1 tablet (4 mg total) by mouth every 8 (eight) hours as needed. Patient not taking: Reported on 07/02/2023 04/08/21   Arline Bennett, MD    Physical Exam: Vitals:   07/02/23 0330 07/02/23 0345 07/02/23 0400 07/02/23 0445  BP: 111/70  107/61   Pulse: (!) 106 99 (!) 101 87  Resp:   15   Temp:   97.6 F (36.4 C)   TempSrc:   Oral   SpO2: 99% 99% 97% 100%  Weight:      Height:       Physical Exam Constitutional:      Appearance: She is obese.  HENT:     Head:     Comments: + R sided facial swelling      Mouth/Throat:     Mouth: Mucous membranes are moist.     Comments: Poor dentition   Eyes:     Pupils: Pupils are equal, round, and reactive to light.  Cardiovascular:     Rate and Rhythm: Normal rate and regular rhythm.  Pulmonary:     Effort: Pulmonary effort is normal.  Abdominal:     General: Bowel sounds are normal.  Skin:    General: Skin is warm.     Comments: + R sided facial redness and swelling  Neurological:     General: No focal deficit present.  Psychiatric:        Mood and Affect: Mood normal.        Data Reviewed:  There are no new results to review at this time. CT Maxillofacial W Contrast CLINICAL DATA:  Sublingual/submandibular facial abscess.  EXAM: CT MAXILLOFACIAL WITH CONTRAST  TECHNIQUE: Multidetector CT imaging of the maxillofacial structures was performed with intravenous contrast. Multiplanar CT image reconstructions were also generated.  RADIATION DOSE REDUCTION: This exam was performed according to the departmental dose-optimization program which includes automated exposure control, adjustment of the mA and/or kV according to patient size and/or use of iterative reconstruction  technique.  CONTRAST:  75mL OMNIPAQUE  IOHEXOL  300 MG/ML  SOLN  COMPARISON:  None Available.  FINDINGS: Osseous: Multiple dental caries with periapical lucencies specifically around the lower left incisors and tooth 3, 10, and 15. There is cellulitis of the base of the upper lip centrally and towards the right, no abscess. No floor of mouth swelling.  Orbits: Negative  Sinuses: Generalized mucosal thickening paranasal sinuses without fluid level.  Soft tissues: Cellulitis is noted above. Submental adenopathy considered reactive.  Limited intracranial: Negative  IMPRESSION: Cellulitis centered on the upper lip and right face, likely odontogenic given numerous dental caries and periapical erosions. No abscess or floor of mouth swelling.  Electronically Signed   By: Ronnette Coke M.D.   On: 07/02/2023 04:07  Lab Results  Component Value Date   WBC 11.5 (H) 07/01/2023   HGB 12.5 07/01/2023   HCT 40.7 07/01/2023   MCV 85.9 07/01/2023   PLT 374 07/01/2023   Last metabolic panel Lab Results  Component Value Date   GLUCOSE 106 (H) 07/01/2023   NA 138 07/01/2023   K 3.6 07/01/2023   CL 101 07/01/2023   CO2 29 07/01/2023   BUN 9 07/01/2023   CREATININE 0.58 07/01/2023   GFRNONAA >60 07/01/2023   CALCIUM 9.2 07/01/2023   PROT 8.6 (H) 07/01/2023   ALBUMIN 4.5 07/01/2023   BILITOT 0.7 07/01/2023   ALKPHOS 57 07/01/2023   AST 40 07/01/2023   ALT 30 07/01/2023   ANIONGAP 8 07/01/2023    Assessment and Plan: * Facial cellulitis Dental Caries  Positive facial cellulitis with concern for odontogenic source based on CT imaging today White count 11.5 S/p IV rocephin  in the ER  Case discussed w/ pharmacy Will place on IV linezolid pending MRSA screen  Blood cultures drawn  Will need dental follow-up Follow    Greater than 50% was spent in counseling and coordination of care with patient Total encounter time 80 minutes or more    Advance Care Planning:   Code  Status: Prior   Consults: None   Family Communication: Sister at the bedside   Severity of Illness: The appropriate patient status for this patient is OBSERVATION. Observation status is judged to be reasonable and necessary in order to provide the required intensity of service to ensure the patient's safety. The patient's presenting symptoms, physical exam findings, and initial radiographic and laboratory data in the context of their medical condition is felt to place them at decreased risk for further clinical deterioration. Furthermore, it is anticipated that the patient will be medically stable for discharge from the hospital within 2 midnights of admission.   Author: Corrinne Din, MD 07/02/2023 7:23 AM  For on call review [email or web address].

## 2023-07-02 NOTE — Plan of Care (Signed)
°  Problem: Education: °Goal: Knowledge of General Education information will improve °Description: Including pain rating scale, medication(s)/side effects and non-pharmacologic comfort measures °Outcome: Progressing °  °Problem: Nutrition: °Goal: Adequate nutrition will be maintained °Outcome: Progressing °  °Problem: Skin Integrity: °Goal: Risk for impaired skin integrity will decrease °Outcome: Progressing °  °

## 2023-07-02 NOTE — Progress Notes (Signed)
 CODE SEPSIS - PHARMACY COMMUNICATION  **Broad Spectrum Antibiotics should be administered within 1 hour of Sepsis diagnosis**  Time Code Sepsis Called/Page Received: 5/10 @ 0419   Antibiotics Ordered: Vanc, rocephin    Time of 1st antibiotic administration: Ceftriaxone  2 gm IV X 1 on 5/10 @ 0419   Additional action taken by pharmacy:   If necessary, Name of Provider/Nurse Contacted:     Shea Swalley D ,PharmD Clinical Pharmacist  07/02/2023  4:31 AM

## 2023-07-02 NOTE — Sepsis Progress Note (Signed)
 Elink monitoring for the code sepsis protocol.

## 2023-07-02 NOTE — Assessment & Plan Note (Addendum)
 Dental Caries  Positive facial cellulitis with concern for odontogenic source based on CT imaging today White count 11.5 S/p IV rocephin  in the ER  Case discussed w/ pharmacy Will place on IV linezolid pending MRSA screen  Blood cultures drawn  Will need dental follow-up Follow

## 2023-07-02 NOTE — ED Notes (Signed)
 A set of blood cultures obtained with IV start in case needed later as antibiotics were ordered. Labeled and sent to lab.

## 2023-07-03 DIAGNOSIS — E669 Obesity, unspecified: Secondary | ICD-10-CM | POA: Insufficient documentation

## 2023-07-03 DIAGNOSIS — L03211 Cellulitis of face: Secondary | ICD-10-CM | POA: Diagnosis not present

## 2023-07-03 LAB — CBC
HCT: 32.6 % — ABNORMAL LOW (ref 36.0–46.0)
Hemoglobin: 10.4 g/dL — ABNORMAL LOW (ref 12.0–15.0)
MCH: 26.8 pg (ref 26.0–34.0)
MCHC: 31.9 g/dL (ref 30.0–36.0)
MCV: 84 fL (ref 80.0–100.0)
Platelets: 349 10*3/uL (ref 150–400)
RBC: 3.88 MIL/uL (ref 3.87–5.11)
RDW: 13.6 % (ref 11.5–15.5)
WBC: 23.2 10*3/uL — ABNORMAL HIGH (ref 4.0–10.5)
nRBC: 0 % (ref 0.0–0.2)

## 2023-07-03 LAB — COMPREHENSIVE METABOLIC PANEL WITH GFR
ALT: 25 U/L (ref 0–44)
AST: 18 U/L (ref 15–41)
Albumin: 3 g/dL — ABNORMAL LOW (ref 3.5–5.0)
Alkaline Phosphatase: 45 U/L (ref 38–126)
Anion gap: 9 (ref 5–15)
BUN: 7 mg/dL (ref 6–20)
CO2: 25 mmol/L (ref 22–32)
Calcium: 8.6 mg/dL — ABNORMAL LOW (ref 8.9–10.3)
Chloride: 105 mmol/L (ref 98–111)
Creatinine, Ser: 0.5 mg/dL (ref 0.44–1.00)
GFR, Estimated: >60 mL/min (ref >60)
Glucose, Bld: 96 mg/dL (ref 70–99)
Potassium: 3.7 mmol/L (ref 3.5–5.1)
Sodium: 139 mmol/L (ref 135–145)
Total Bilirubin: 0.4 mg/dL (ref 0.0–1.2)
Total Protein: 6.4 g/dL — ABNORMAL LOW (ref 6.5–8.1)

## 2023-07-03 MED ORDER — LIDOCAINE HCL 1 % IJ SOLN
5.0000 mL | Freq: Once | INTRAMUSCULAR | Status: AC
  Administered 2023-07-03: 5 mL via INTRADERMAL
  Filled 2023-07-03: qty 5

## 2023-07-03 MED ORDER — AMOXICILLIN 875 MG PO TABS: 875.0000 mg | ORAL_TABLET | Freq: Two times a day (BID) | ORAL | 0 refills | Status: AC

## 2023-07-03 MED ORDER — DOXYCYCLINE HYCLATE 100 MG PO CAPS
100.0000 mg | ORAL_CAPSULE | Freq: Two times a day (BID) | ORAL | 0 refills | Status: AC
Start: 2023-07-03 — End: ?

## 2023-07-03 NOTE — Discharge Summary (Signed)
 Alison Mathews:[id number] DOB: [birth year] DOA: 07/02/2023  PCP: Daneil Dunker, MD  Admit date: 07/02/2023 Discharge date: 07/03/2023  Time spent: 35 minutes  Recommendations for Outpatient Follow-up:  Pcp f/u within 1 week     Discharge Diagnoses:  Principal Problem:   Facial cellulitis Active Problems:   Obesity (BMI 30-39.9)   Discharge Condition: improving  Diet recommendation: regular  Filed Weights   07/01/23 2029  Weight: 78 kg    History of present illness:  From admission h and p Alison Mathews is a 38 y.o. female with medical history significant of obesity anxiety presenting with facial cellulitis and odontogenic infection.  Patient reports worsening right sided facial pain and swelling over the past 24 hours.  General right cheek redness x 3 days.  Baseline history of dental caries.  Denies any significant trouble swallowing.  Positive pain with opening the jaw.  No shortness of breath.  Positive mild headache.  Denies any alcohol use.  No tobacco use.  Denies any prior episodes like this in the past.   Hospital Course:  Patient presents with abscess/cellulitis right cheek. CT of face shows possible odontogenic infection but exam not consistent with this. Treated with IV linezolid with clinical improvement. On day of discharge infection had come to a head. The area was anesthestized with lidocaine  and needle I and D was performed. Moderate purulence expressed which was sent for culture. The abscess cavity was probed and all purulent material expressed. Small cavity, no packing performed. Patient did not fail outpatient oral antibiotics. Will d/c home with 7 day course of doxy/amoxicillin, will f/u culture results (suspect staph infection), advise tid warm compresses, and reviewed return precautions.   Procedures: Bedside I and D   Consultations: none  Discharge Exam: Vitals:   07/03/23 0331 07/03/23 0900  BP: 116/67 119/76  Pulse: 83 92  Resp: 16 16   Temp: 97.7 F (36.5 C) 97.8 F (36.6 C)  SpO2: 99% 99%    General: NAD, erythematous tender nodule right cheeck Cardiovascular: rrr Respiratory: ctab  Discharge Instructions   Discharge Instructions     Diet - low sodium heart healthy   Complete by: As directed    Increase activity slowly   Complete by: As directed       Allergies as of 07/03/2023       Reactions   Motrin [ibuprofen] Nausea And Vomiting        Medication List     STOP taking these medications    clonazePAM  0.5 MG tablet Commonly known as: KLONOPIN    gabapentin  300 MG capsule Commonly known as: NEURONTIN    ondansetron  4 MG disintegrating tablet Commonly known as: ZOFRAN -ODT       TAKE these medications    amoxicillin 875 MG tablet Commonly known as: AMOXIL Take 1 tablet (875 mg total) by mouth 2 (two) times daily for 7 days.   doxycycline 100 MG capsule Commonly known as: VIBRAMYCIN Take 1 capsule (100 mg total) by mouth 2 (two) times daily.       Allergies  Allergen Reactions   Motrin [Ibuprofen] Nausea And Vomiting    Follow-up Information     Cooner, Jonni Nettle, MD Follow up.   Specialty: Internal Medicine Why: this week Contact information: [address] [facility] [address] [phone]                  The results of significant diagnostics from this hospitalization (including imaging, microbiology, ancillary and laboratory)  are listed below for reference.    Significant Diagnostic Studies: CT Maxillofacial W Contrast Result Date: 07/02/2023 CLINICAL DATA:  Sublingual/submandibular facial abscess. EXAM: CT MAXILLOFACIAL WITH CONTRAST TECHNIQUE: Multidetector CT imaging of the maxillofacial structures was performed with intravenous contrast. Multiplanar CT image reconstructions were also generated. RADIATION DOSE REDUCTION: This exam was performed according to the departmental dose-optimization program which includes automated exposure  control, adjustment of the mA and/or kV according to patient size and/or use of iterative reconstruction technique. CONTRAST:  75mL OMNIPAQUE  IOHEXOL  300 MG/ML  SOLN COMPARISON:  None Available. FINDINGS: Osseous: Multiple dental caries with periapical lucencies specifically around the lower left incisors and tooth 3, 10, and 15. There is cellulitis of the base of the upper lip centrally and towards the right, no abscess. No floor of mouth swelling. Orbits: Negative Sinuses: Generalized mucosal thickening paranasal sinuses without fluid level. Soft tissues: Cellulitis is noted above. Submental adenopathy considered reactive. Limited intracranial: Negative IMPRESSION: Cellulitis centered on the upper lip and right face, likely odontogenic given numerous dental caries and periapical erosions. No abscess or floor of mouth swelling. Electronically Signed   By: Ronnette Coke M.D.   On: 07/02/2023 04:07    Microbiology: Recent Results (from the past 240 hours)  Blood Culture (routine x 2)     Status: None (Preliminary result)   Collection Time: 07/02/23  1:30 AM   Specimen: BLOOD  Result Value Ref Range Status   Specimen Description BLOOD RAC  Final   Special Requests   Final    BOTTLES DRAWN AEROBIC AND ANAEROBIC Blood Culture adequate volume   Culture   Final    NO GROWTH 1 DAY Performed at [facility], [address] [place], [place] [address]    Report Status PENDING  Incomplete  Blood Culture (routine x 2)     Status: None (Preliminary result)   Collection Time: 07/02/23  4:36 AM   Specimen: BLOOD  Result Value Ref Range Status   Specimen Description BLOOD BLOOD RIGHT ARM  Final   Special Requests   Final    BOTTLES DRAWN AEROBIC AND ANAEROBIC Blood Culture adequate volume   Culture   Final    NO GROWTH 1 DAY Performed at [facility], [address], [place], [place] [address]    Report Status PENDING  Incomplete  MRSA Next Gen by PCR, Nasal     Status: None    Collection Time: 07/02/23  9:52 AM   Specimen: Nasal Mucosa; Nasal Swab  Result Value Ref Range Status   MRSA by PCR Next Gen NOT DETECTED NOT DETECTED Final    Comment: (NOTE) The GeneXpert MRSA Assay (FDA approved for NASAL specimens only), is one component of a comprehensive MRSA colonization surveillance program. It is not intended to diagnose MRSA infection nor to guide or monitor treatment for MRSA infections. Test performance is not FDA approved in patients less than 6 years old. Performed at [facility], [address] [place], [place] [address]      Labs: Basic Metabolic Panel: Recent Labs  Lab 07/01/23 2032 07/03/23 0508  NA 138 139  K 3.6 3.7  CL 101 105  CO2 29 25  GLUCOSE 106* 96  BUN 9 7  CREATININE 0.58 0.50  CALCIUM 9.2 8.6*   Liver Function Tests: Recent Labs  Lab 07/01/23 2032 07/03/23 0508  AST 40 18  ALT 30 25  ALKPHOS 57 45  BILITOT 0.7 0.4  PROT 8.6* 6.4*  ALBUMIN 4.5 3.0*  No results for input(s): "LIPASE", "AMYLASE" in the last 168 hours. No results for input(s): "AMMONIA" in the last 168 hours. CBC: Recent Labs  Lab 07/01/23 2032 07/03/23 0508  WBC 11.5* 23.2*  NEUTROABS 8.0*  --   HGB 12.5 10.4*  HCT 40.7 32.6*  MCV 85.9 84.0  PLT 374 349   Cardiac Enzymes: No results for input(s): "CKTOTAL", "CKMB", "CKMBINDEX", "TROPONINI" in the last 168 hours. BNP: BNP (last 3 results) No results for input(s): "BNP" in the last 8760 hours.  ProBNP (last 3 results) No results for input(s): "PROBNP" in the last 8760 hours.  CBG: No results for input(s): "GLUCAP" in the last 168 hours.     Signed:  Raymonde Calico MD.  [facility] 07/03/2023, 2:46 PM

## 2023-07-04 DIAGNOSIS — Z419 Encounter for procedure for purposes other than remedying health state, unspecified: Secondary | ICD-10-CM | POA: Diagnosis not present

## 2023-07-05 LAB — AEROBIC CULTURE W GRAM STAIN (SUPERFICIAL SPECIMEN)

## 2023-07-07 LAB — CULTURE, BLOOD (ROUTINE X 2)
Culture: NO GROWTH
Culture: NO GROWTH
Special Requests: ADEQUATE
Special Requests: ADEQUATE

## 2023-08-04 DIAGNOSIS — Z419 Encounter for procedure for purposes other than remedying health state, unspecified: Secondary | ICD-10-CM | POA: Diagnosis not present

## 2023-08-29 DIAGNOSIS — F419 Anxiety disorder, unspecified: Secondary | ICD-10-CM | POA: Diagnosis not present

## 2023-08-29 DIAGNOSIS — F411 Generalized anxiety disorder: Secondary | ICD-10-CM | POA: Diagnosis not present

## 2023-08-29 DIAGNOSIS — N926 Irregular menstruation, unspecified: Secondary | ICD-10-CM | POA: Diagnosis not present

## 2023-08-29 DIAGNOSIS — Z7689 Persons encountering health services in other specified circumstances: Secondary | ICD-10-CM | POA: Diagnosis not present

## 2023-08-29 DIAGNOSIS — L0201 Cutaneous abscess of face: Secondary | ICD-10-CM | POA: Diagnosis not present

## 2023-08-29 DIAGNOSIS — M222X1 Patellofemoral disorders, right knee: Secondary | ICD-10-CM | POA: Diagnosis not present

## 2023-09-03 DIAGNOSIS — Z419 Encounter for procedure for purposes other than remedying health state, unspecified: Secondary | ICD-10-CM | POA: Diagnosis not present

## 2023-10-04 DIAGNOSIS — Z419 Encounter for procedure for purposes other than remedying health state, unspecified: Secondary | ICD-10-CM | POA: Diagnosis not present

## 2023-11-04 DIAGNOSIS — Z419 Encounter for procedure for purposes other than remedying health state, unspecified: Secondary | ICD-10-CM | POA: Diagnosis not present

## 2023-12-04 DIAGNOSIS — Z419 Encounter for procedure for purposes other than remedying health state, unspecified: Secondary | ICD-10-CM | POA: Diagnosis not present

## 2024-01-04 DIAGNOSIS — Z419 Encounter for procedure for purposes other than remedying health state, unspecified: Secondary | ICD-10-CM | POA: Diagnosis not present

## 2024-02-03 DIAGNOSIS — Z419 Encounter for procedure for purposes other than remedying health state, unspecified: Secondary | ICD-10-CM | POA: Diagnosis not present
